# Patient Record
Sex: Male | Born: 1975 | Race: Black or African American | Hispanic: No | Marital: Single | Smoking: Current every day smoker
Health system: Southern US, Community
[De-identification: ages and names within clinical notes are randomized; demographics above are authoritative.]

## PROBLEM LIST (undated history)

## (undated) DIAGNOSIS — E785 Hyperlipidemia, unspecified: Secondary | ICD-10-CM

## (undated) DIAGNOSIS — Z87898 Personal history of other specified conditions: Secondary | ICD-10-CM

## (undated) HISTORY — DX: Personal history of other specified conditions: Z87.898

## (undated) HISTORY — PX: OTHER SURGICAL HISTORY: SHX169

## (undated) HISTORY — DX: Hyperlipidemia, unspecified: E78.5

---

## 2001-12-14 ENCOUNTER — Observation Stay (HOSPITAL_COMMUNITY): Admission: EM | Admit: 2001-12-14 | Discharge: 2001-12-14 | Payer: Self-pay | Admitting: Emergency Medicine

## 2001-12-14 ENCOUNTER — Encounter: Payer: Self-pay | Admitting: Emergency Medicine

## 2001-12-14 IMAGING — CT CT HEAD W/O CM
2 of 7 series · 13 of 40 positions shown, 16 images · non-contrast
Comparison: none

FINDINGS
CLINICAL DATA: NECK LACERATION.  BRUISING TO THE FACE, PARTICULARLY AROUND THE RIGHT EYE WITH
BLOOD FROM THE NOSE AND MOUTH.
CT HEAD WITHOUT CONTRAST MEDIA
THERE IS SOFT TISSUE SWELLING OVER THE RIGHT ORBIT AND EXTENDING ACROSS THE REGION OF THE NOSE.
THERE IS NO ACUTE INTRACRANIAL ABNORMALITY.  NO DISCRETE FRACTURE.
IMPRESSION
NEGATIVE CT HEAD.
CT MAXILLOFACIAL LTD W/O
THERE IS AN IMPACTED COMMINUTED FRACTURE OF THE NASAL BONES WITH EXTENSIVE SOFT TISSUE SWELLING.
THERE IS ALSO SOME EXTRACONAL AIR ADJACENT TO THE MEDIAL RECTUS MUSCLE IN THE RIGHT ORBIT.  THERE
IS A TINY FRACTURE IN THE MEDIAL WALL OF THE RIGHT ORBIT SEEN ON IMAGES #21 AND 22 INTO ONE OF THE
MIDDLE ETHMOID AIR CELLS.  THERE IS SOME MINIMAL OPACIFICATION OF THAT ETHMOID AIR CELL.
NO OTHER SIGNIFICANT ABNORMALITY.
THE OTHER BONY STRUCTURES ARE INTACT.
COMMINUTED NASAL BONE FRACTURE.  SMALL FRACTURE OF THE MEDIAL WALL OF THE RIGHT ORBIT INTO A MIDDLE
ETHMOID AIR CELL.
CT MULTIPLANAR RECONSTRUCTIONS
THE CORONAL RECONSTRUCTIONS DEMONSTRATE THE SLIGHT DISPLACEMENT IN THE COMMINUTION OF THE NASAL
BONE FRACTURES.   FRACTURE OF THE MEDIAL WALL OF THE RIGHT ORBIT IS NOT WELL SEEN.
NO OTHER ABNORMALITY.
NASAL BONE AND MEDIAL RIGHT ORBITAL WALL FRACTURES.

[Series 7971: — · axial · 0.33mm/px · z∈[-699,-564]mm · 12 of 64 slices shown, 15 images (1 of 2)]
[im 5/64  brain]
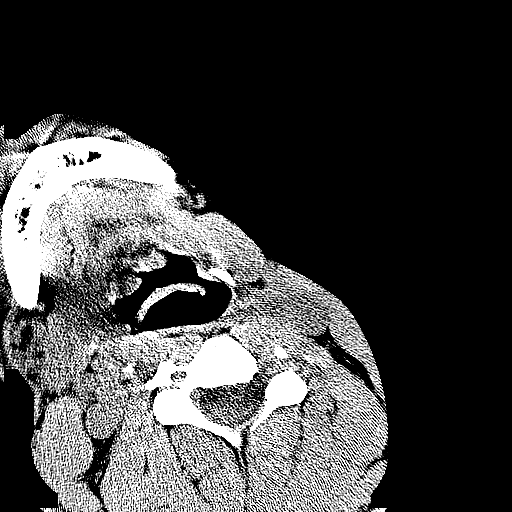
[im 5/64  bone]
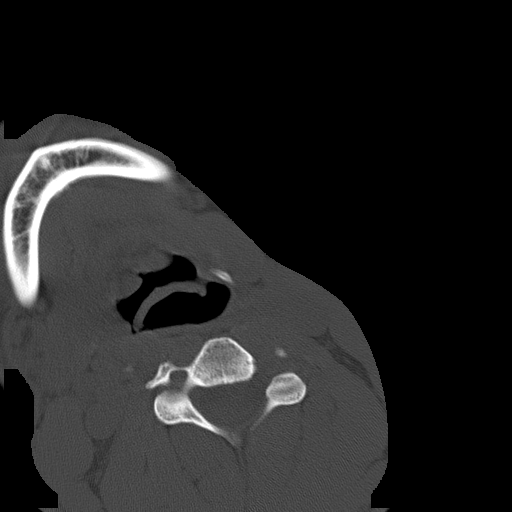
[im 10/64  brain]
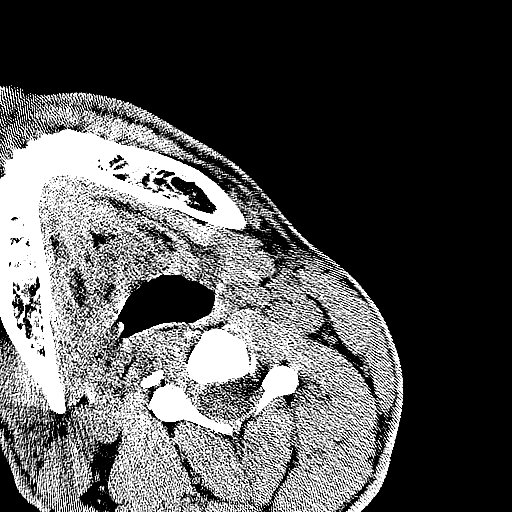
[im 15/64  brain]
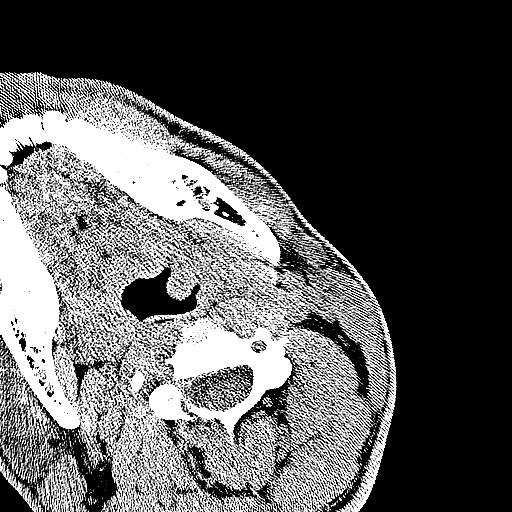
[im 20/64  brain]
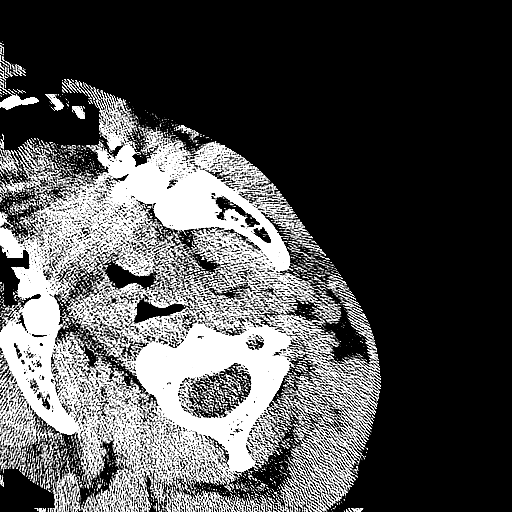
[im 25/64  brain]
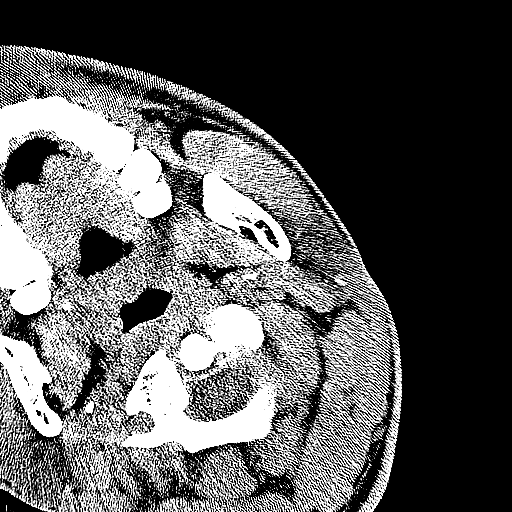
[im 25/64  bone]
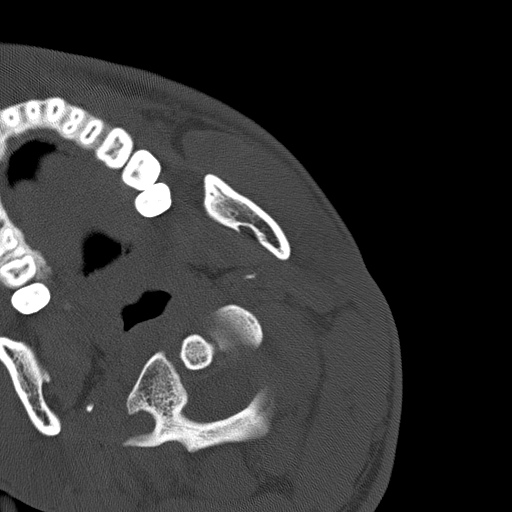
[im 30/64  brain]
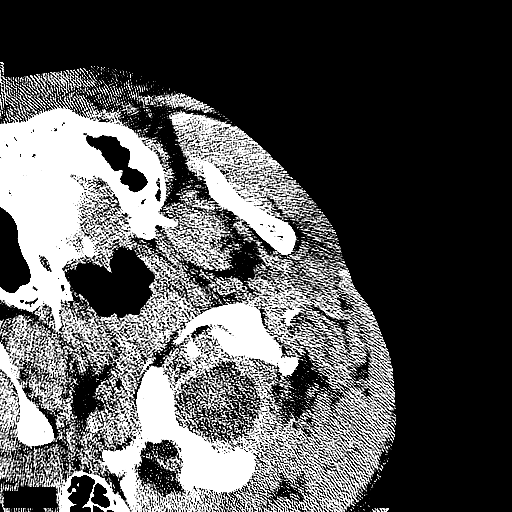
[im 34/64  brain]
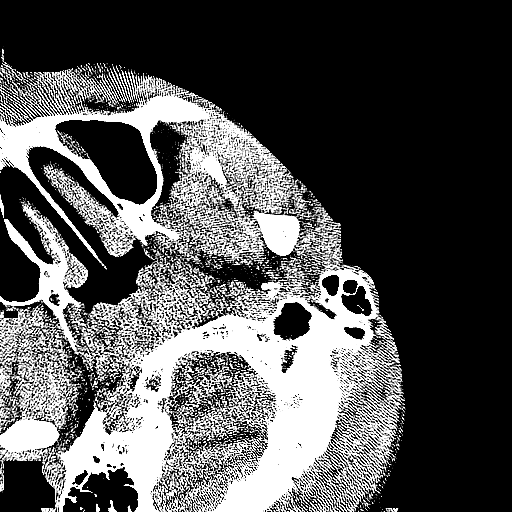
[im 39/64  brain]
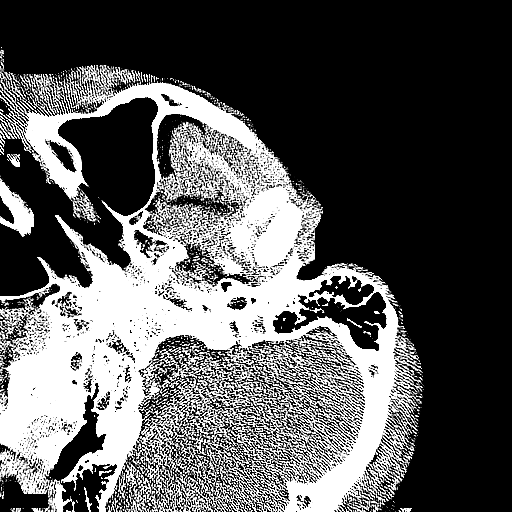
[im 44/64  brain]
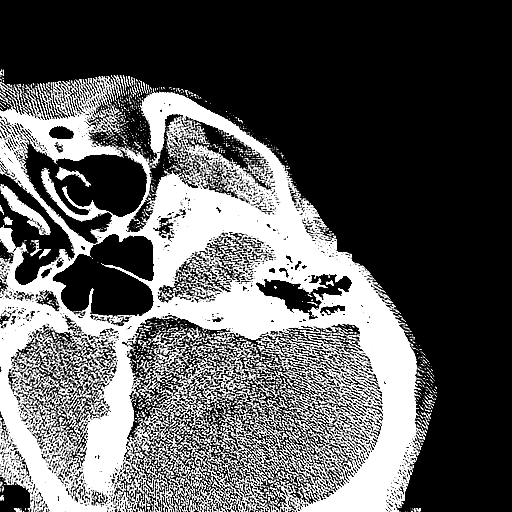
[im 44/64  bone]
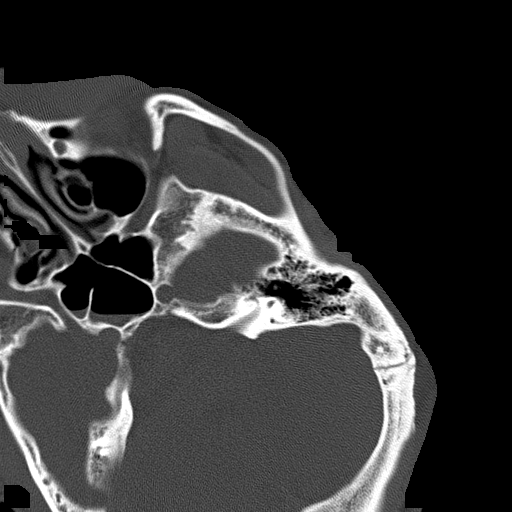
[im 49/64  brain]
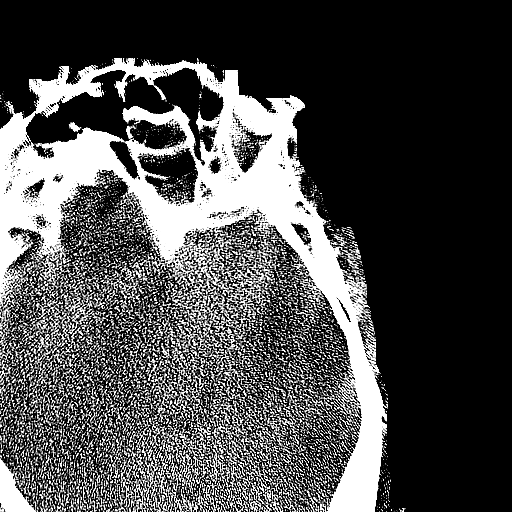
[im 54/64  brain]
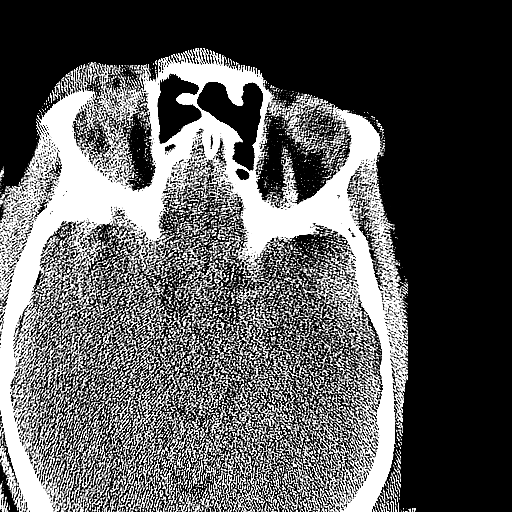
[im 59/64  brain]
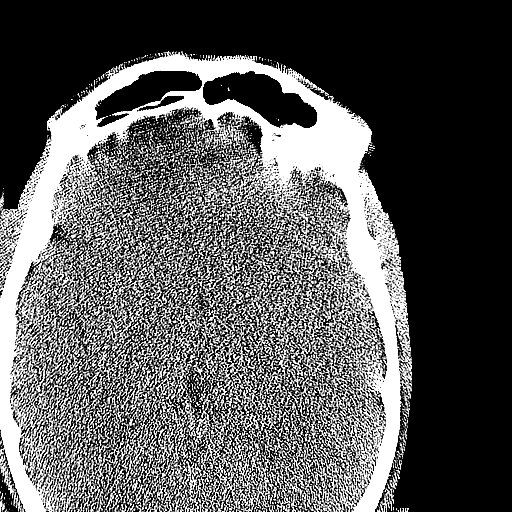

[— · coronal · 0.34mm/px · 1 of 60 slices shown (2 of 2)]
[im 30/60  brain]
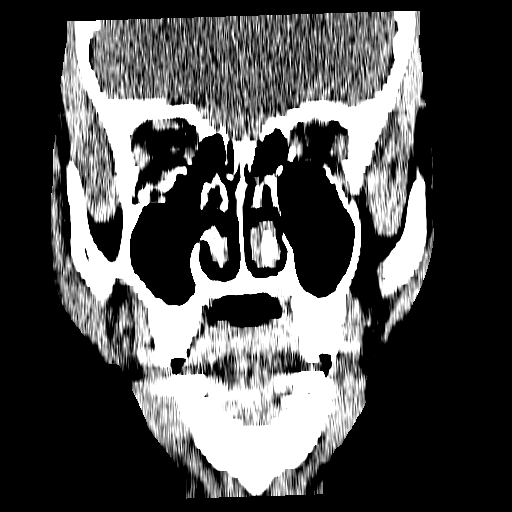

[13 of 40 positions shown; findings below may reference images not displayed]

## 2001-12-14 IMAGING — CT CT HEAD W/O CM
1 of 2 series · 13 of 30 positions shown, 17 images · non-contrast
Comparison: none

FINDINGS
CLINICAL DATA: NECK LACERATION.  BRUISING TO THE FACE, PARTICULARLY AROUND THE RIGHT EYE WITH
BLOOD FROM THE NOSE AND MOUTH.
CT HEAD WITHOUT CONTRAST MEDIA
THERE IS SOFT TISSUE SWELLING OVER THE RIGHT ORBIT AND EXTENDING ACROSS THE REGION OF THE NOSE.
THERE IS NO ACUTE INTRACRANIAL ABNORMALITY.  NO DISCRETE FRACTURE.
IMPRESSION
NEGATIVE CT HEAD.
CT MAXILLOFACIAL LTD W/O
THERE IS AN IMPACTED COMMINUTED FRACTURE OF THE NASAL BONES WITH EXTENSIVE SOFT TISSUE SWELLING.
THERE IS ALSO SOME EXTRACONAL AIR ADJACENT TO THE MEDIAL RECTUS MUSCLE IN THE RIGHT ORBIT.  THERE
IS A TINY FRACTURE IN THE MEDIAL WALL OF THE RIGHT ORBIT SEEN ON IMAGES #21 AND 22 INTO ONE OF THE
MIDDLE ETHMOID AIR CELLS.  THERE IS SOME MINIMAL OPACIFICATION OF THAT ETHMOID AIR CELL.
NO OTHER SIGNIFICANT ABNORMALITY.
THE OTHER BONY STRUCTURES ARE INTACT.
COMMINUTED NASAL BONE FRACTURE.  SMALL FRACTURE OF THE MEDIAL WALL OF THE RIGHT ORBIT INTO A MIDDLE
ETHMOID AIR CELL.
CT MULTIPLANAR RECONSTRUCTIONS
THE CORONAL RECONSTRUCTIONS DEMONSTRATE THE SLIGHT DISPLACEMENT IN THE COMMINUTION OF THE NASAL
BONE FRACTURES.   FRACTURE OF THE MEDIAL WALL OF THE RIGHT ORBIT IS NOT WELL SEEN.
NO OTHER ABNORMALITY.
NASAL BONE AND MEDIAL RIGHT ORBITAL WALL FRACTURES.

[Series 7968: — · axial · 0.49mm/px · z∈[-570,-460]mm · 13 of 26 slices shown, 17 images]
[im 2/26  brain]
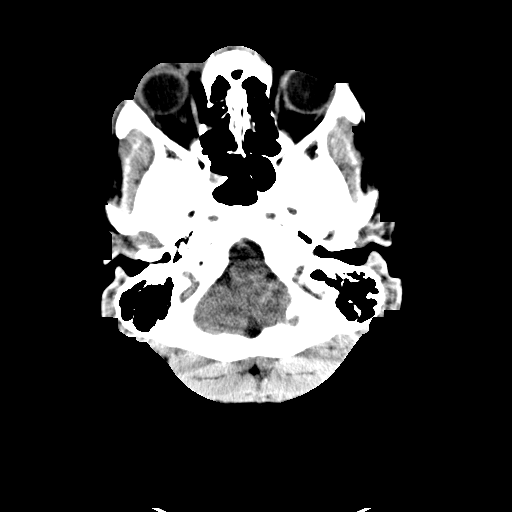
[im 2/26  bone]
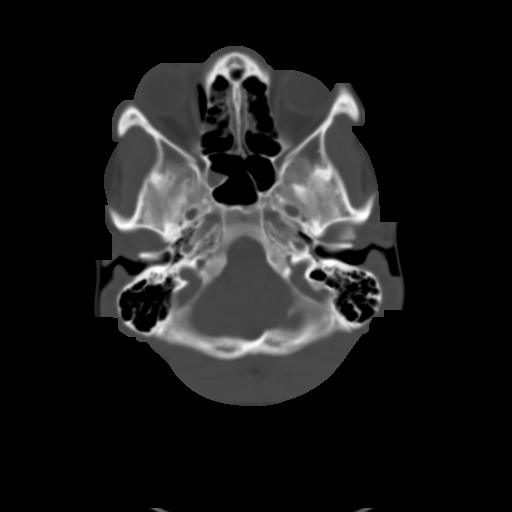
[im 4/26  brain]
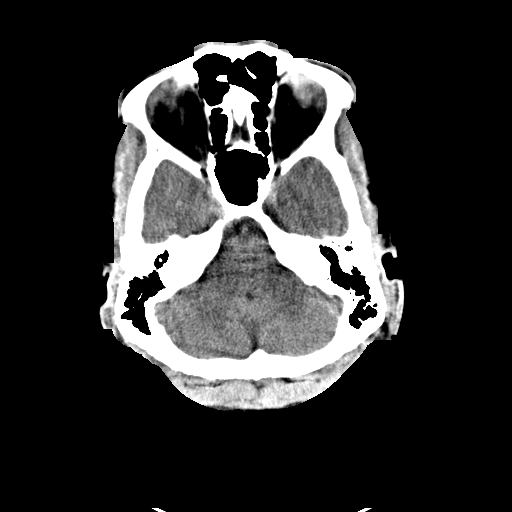
[im 6/26  brain]
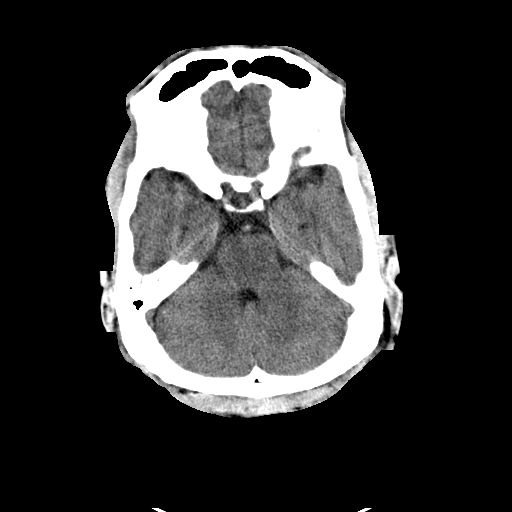
[im 8/26  brain]
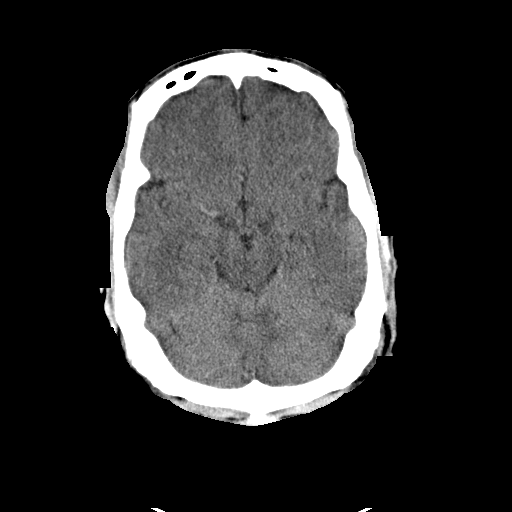
[im 9/26  brain]
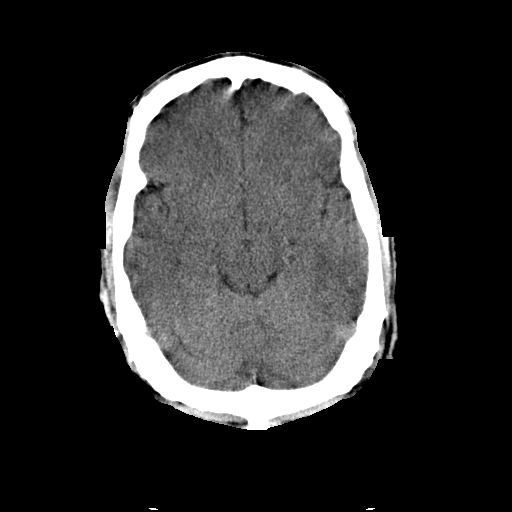
[im 9/26  bone]
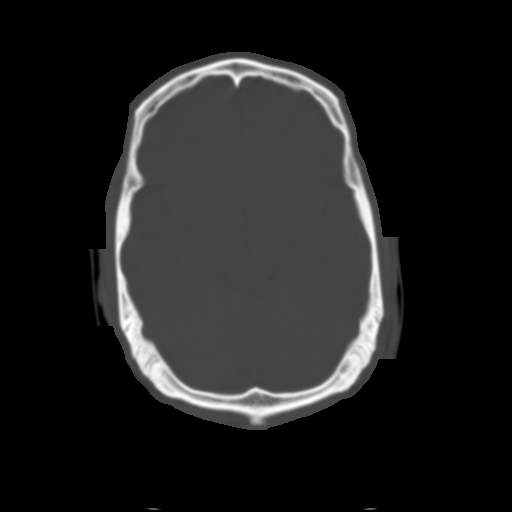
[im 11/26  brain]
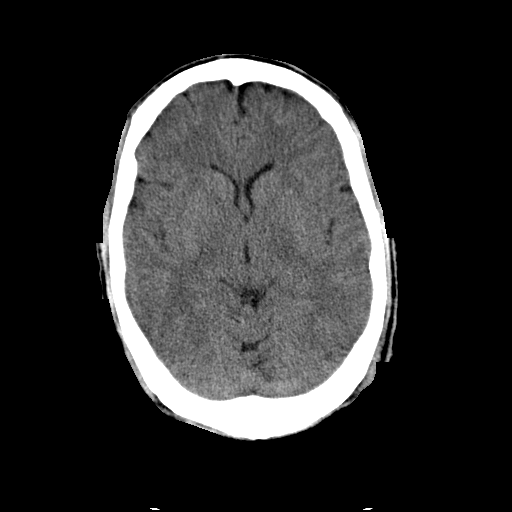
[im 13/26  brain]
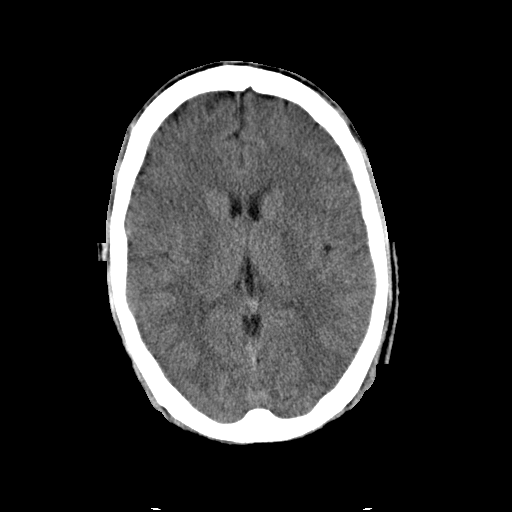
[im 15/26  brain]
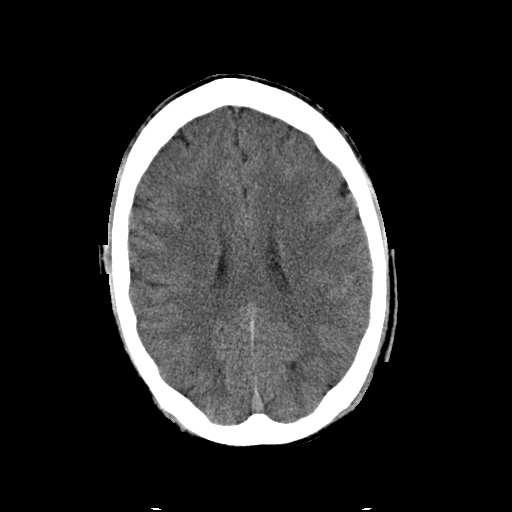
[im 17/26  brain]
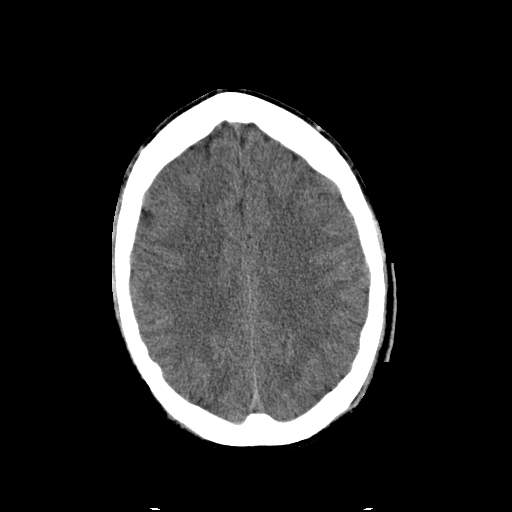
[im 17/26  bone]
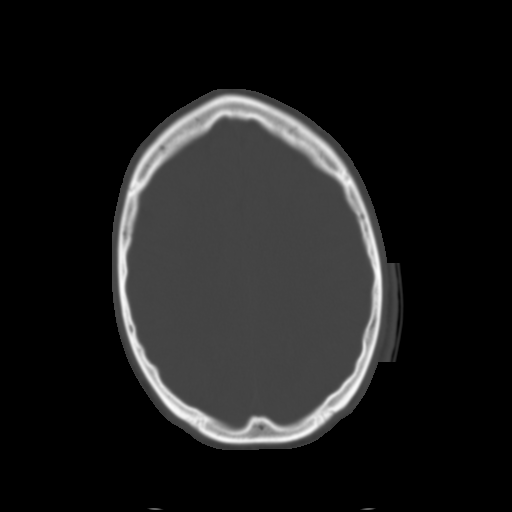
[im 18/26  brain]
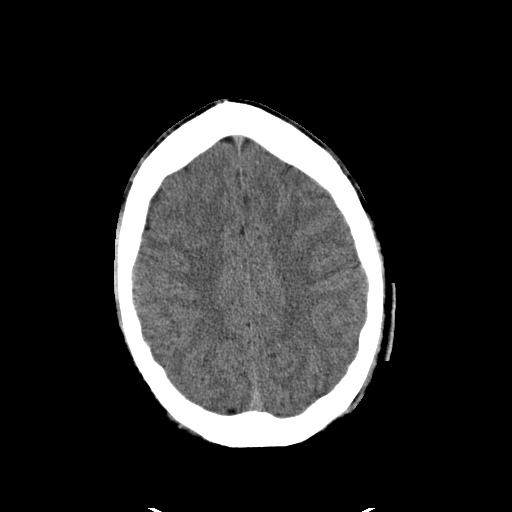
[im 20/26  brain]
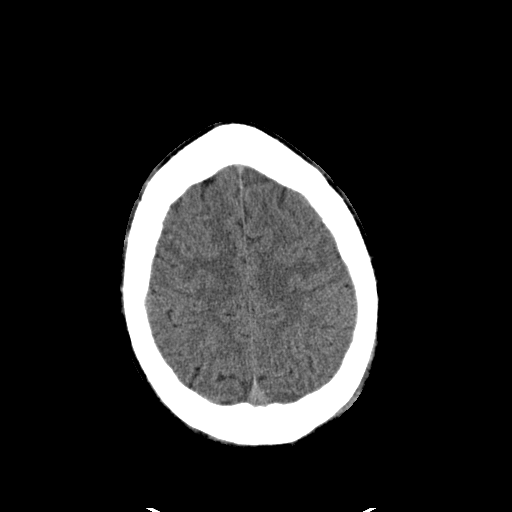
[im 22/26  brain]
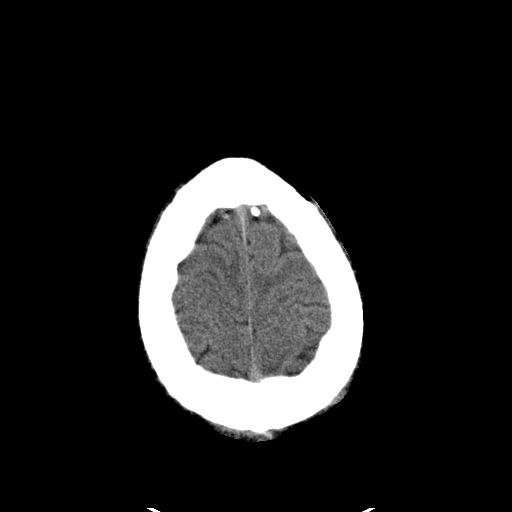
[im 24/26  brain]
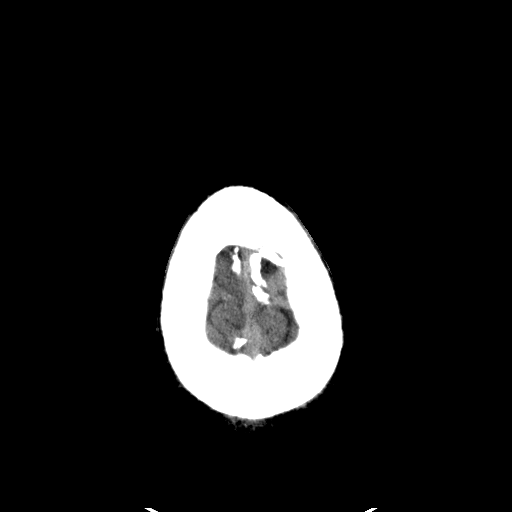
[im 24/26  bone]
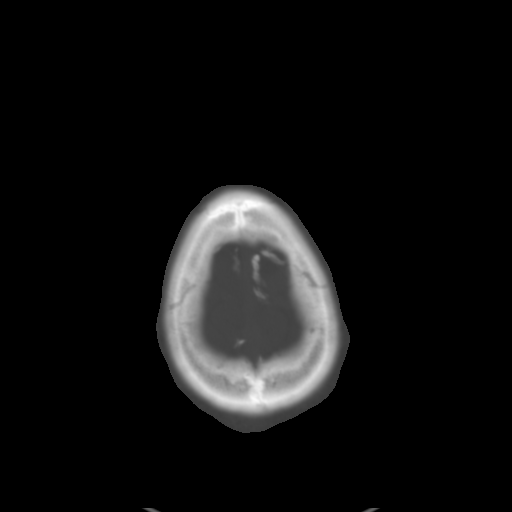

[13 of 30 positions shown; findings below may reference images not displayed]

## 2003-07-07 ENCOUNTER — Emergency Department (HOSPITAL_COMMUNITY): Admission: EM | Admit: 2003-07-07 | Discharge: 2003-07-07 | Payer: Self-pay | Admitting: Emergency Medicine

## 2003-07-07 ENCOUNTER — Encounter: Payer: Self-pay | Admitting: Emergency Medicine

## 2004-02-24 ENCOUNTER — Emergency Department (HOSPITAL_COMMUNITY): Admission: EM | Admit: 2004-02-24 | Discharge: 2004-02-24 | Payer: Self-pay | Admitting: Emergency Medicine

## 2006-12-12 ENCOUNTER — Emergency Department (HOSPITAL_COMMUNITY): Admission: EM | Admit: 2006-12-12 | Discharge: 2006-12-12 | Payer: Self-pay | Admitting: Emergency Medicine

## 2006-12-12 IMAGING — CR DG CHEST 2V
2 series · 2 of 2 positions shown · non-contrast
Comparison: none

CLINICAL DATA: Headache, chills and cough.    
 CHEST - 2 VIEW:

[view not recorded (1 of 2)]
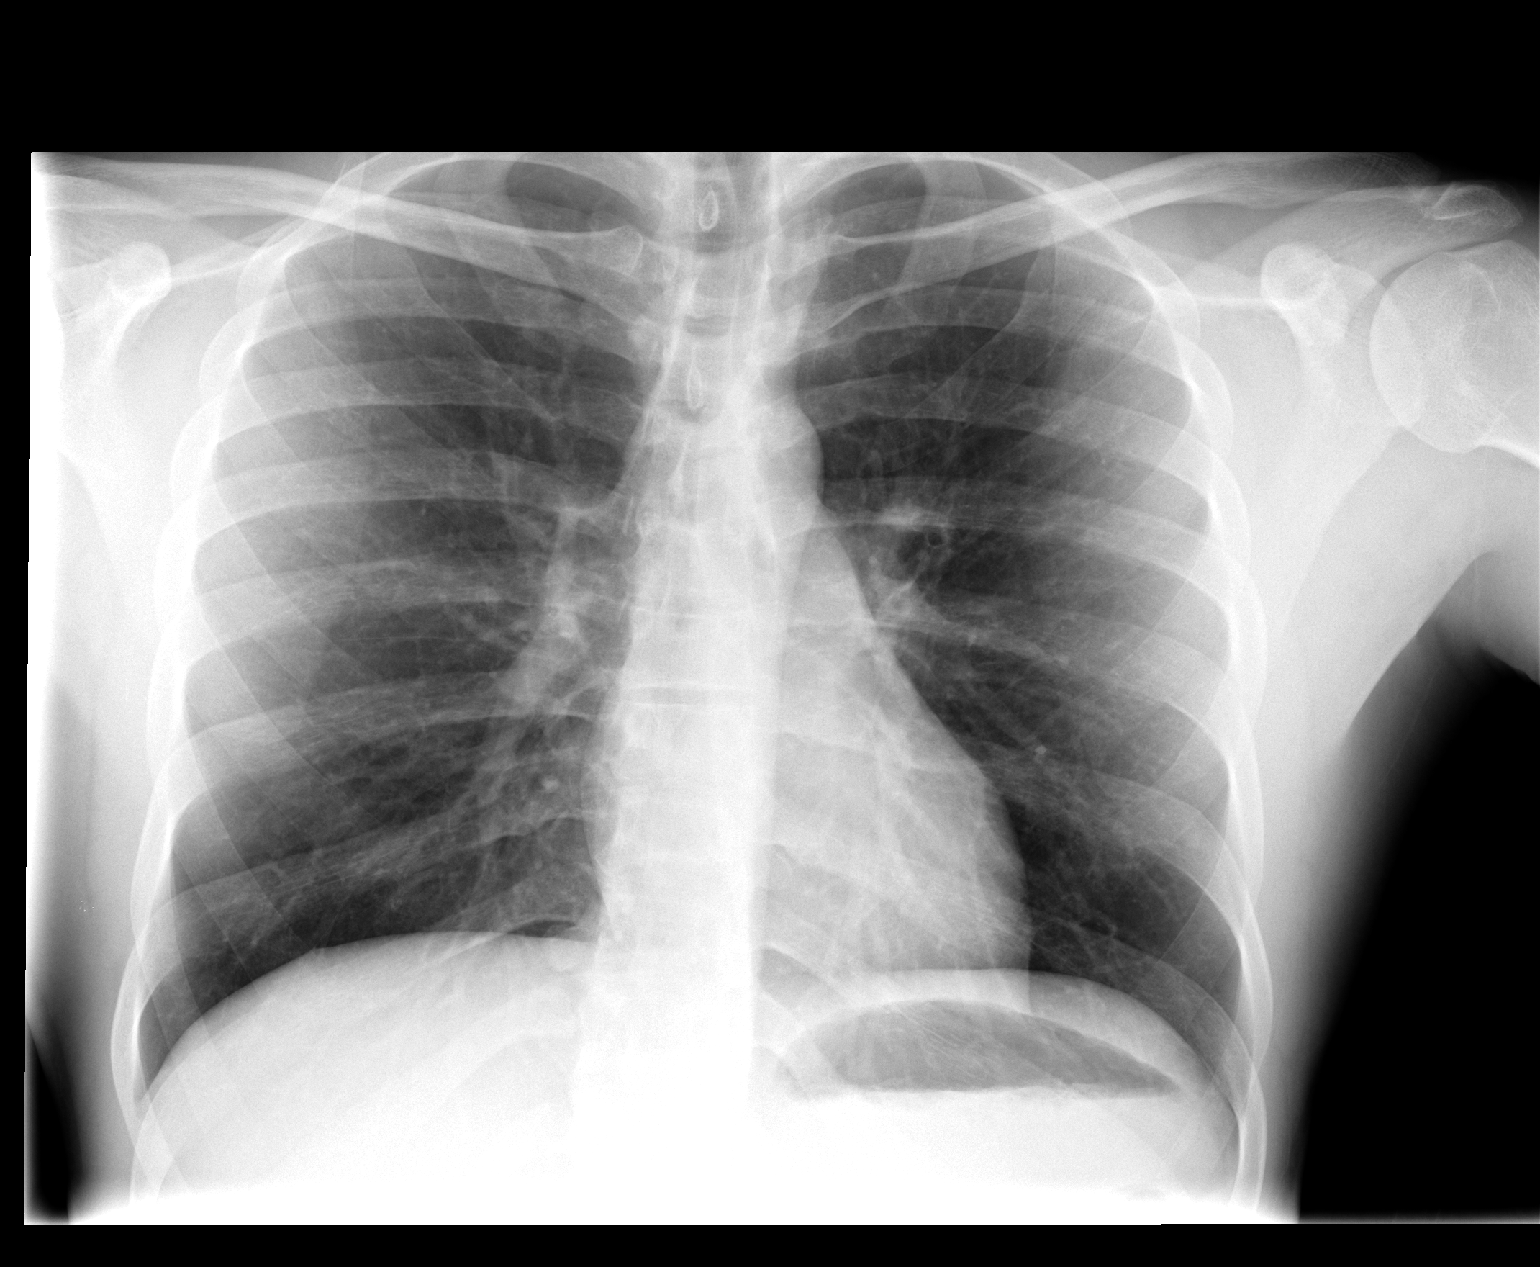

[view not recorded (2 of 2)]
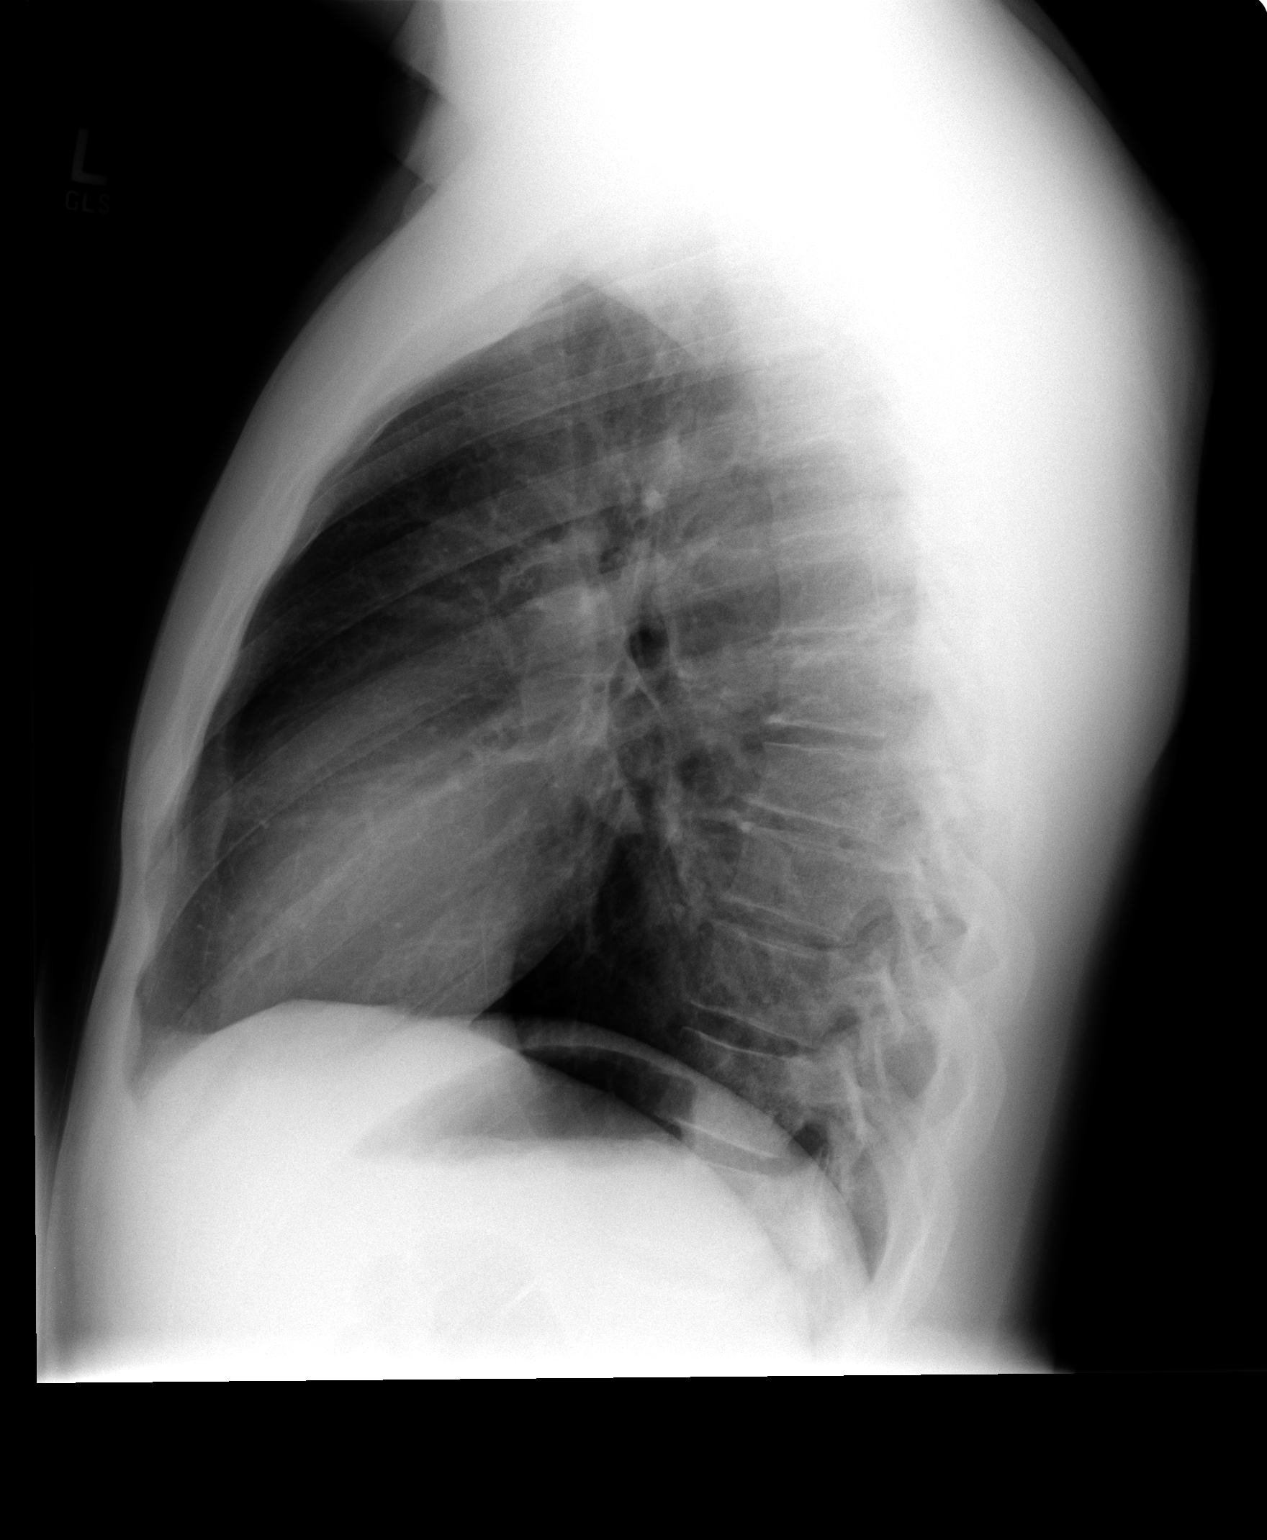

[2 of 2 positions shown; findings below may reference images not displayed]

FINDINGS: Heart size is normal. No effusions or edema.  No airspace opacities are identified.
IMPRESSION: No active disease.

## 2007-12-06 ENCOUNTER — Emergency Department (HOSPITAL_COMMUNITY): Admission: EM | Admit: 2007-12-06 | Discharge: 2007-12-06 | Payer: Self-pay | Admitting: Emergency Medicine
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
## 2015-01-14 ENCOUNTER — Encounter (HOSPITAL_COMMUNITY): Payer: Self-pay | Admitting: *Deleted

## 2015-01-14 ENCOUNTER — Emergency Department (HOSPITAL_COMMUNITY)
Admission: EM | Admit: 2015-01-14 | Discharge: 2015-01-14 | Disposition: A | Discharge status: Home / Self Care | Payer: Self-pay | Attending: Emergency Medicine | Admitting: Emergency Medicine

## 2015-01-14 DIAGNOSIS — K047 Periapical abscess without sinus: Secondary | ICD-10-CM | POA: Insufficient documentation

## 2015-01-14 DIAGNOSIS — K029 Dental caries, unspecified: Secondary | ICD-10-CM | POA: Insufficient documentation

## 2015-01-14 DIAGNOSIS — Z720 Tobacco use: Secondary | ICD-10-CM | POA: Insufficient documentation

## 2015-01-14 MED ORDER — LIDOCAINE HCL (PF) 1 % IJ SOLN
INTRAMUSCULAR | Status: AC
  Administered 2015-01-14: 2.1 mL
  Filled 2015-01-14: qty 5

## 2015-01-14 MED ORDER — HYDROCODONE-ACETAMINOPHEN 5-325 MG PO TABS
2.0000 | ORAL_TABLET | Freq: Once | ORAL | Status: AC
  Administered 2015-01-14: 2 via ORAL
  Filled 2015-01-14: qty 2

## 2015-01-14 MED ORDER — HYDROCODONE-ACETAMINOPHEN 5-325 MG PO TABS: 1.0000 | ORAL_TABLET | ORAL | Status: DC | PRN

## 2015-01-14 MED ORDER — CEFTRIAXONE SODIUM 1 G IJ SOLR
1.0000 g | Freq: Once | INTRAMUSCULAR | Status: AC
  Administered 2015-01-14: 1 g via INTRAMUSCULAR
  Filled 2015-01-14: qty 10

## 2015-01-14 MED ORDER — ONDANSETRON HCL 4 MG PO TABS
4.0000 mg | ORAL_TABLET | Freq: Once | ORAL | Status: AC
  Administered 2015-01-14: 4 mg via ORAL
  Filled 2015-01-14: qty 1

## 2015-01-14 MED ORDER — AMOXICILLIN 500 MG PO CAPS: 500.0000 mg | ORAL_CAPSULE | Freq: Three times a day (TID) | ORAL | Status: DC

## 2015-01-14 MED ORDER — IBUPROFEN 800 MG PO TABS: 800.0000 mg | ORAL_TABLET | Freq: Three times a day (TID) | ORAL | Status: DC

## 2015-01-14 MED ORDER — IBUPROFEN 800 MG PO TABS
800.0000 mg | ORAL_TABLET | Freq: Once | ORAL | Status: AC
  Administered 2015-01-14: 800 mg via ORAL
  Filled 2015-01-14: qty 1

## 2015-01-14 NOTE — ED Notes (Signed)
Top right tooth pain since 1400 today.  Has taken Motrin 444m x 1 without relief.

## 2015-01-14 NOTE — ED Provider Notes (Signed)
CSN: [id number]     Arrival date & time 01/14/15  1807 History   First MD Initiated Contact with Patient 01/14/15 1924     Chief Complaint  Patient presents with  . Dental Pain     (Consider location/radiation/quality/duration/timing/severity/associated sxs/prior Treatment) Patient is a 39 y.o. male presenting with tooth pain. The history is provided by the patient.  Dental Pain Location:  Upper Upper teeth location:  2/RU 2nd molar Quality:  Aching, throbbing and shooting Severity:  Severe Onset quality:  Gradual Duration:  6 hours Timing:  Intermittent Progression:  Worsening Chronicity:  Chronic Context: abscess, dental caries and poor dentition   Relieved by:  Nothing Worsened by:  Hot food/drink and cold food/drink Ineffective treatments:  NSAIDs Associated symptoms: facial pain and gum swelling   Associated symptoms: no fever, no neck pain and no trismus   Risk factors: lack of dental care   Risk factors: no diabetes and no immunosuppression     History reviewed. No pertinent past medical history. History reviewed. No pertinent past surgical history. History reviewed. No pertinent family history. History  Substance Use Topics  . Smoking status: Current Every Day Smoker -- 0.75 packs/day    Types: Cigarettes  . Smokeless tobacco: Not on file  . Alcohol Use: 1.2 oz/week    2 Cans of beer per week     Comment: daily    Review of Systems  Constitutional: Negative for fever and activity change.       All ROS Neg except as noted in HPI  HENT: Positive for dental problem. Negative for nosebleeds.   Eyes: Negative for photophobia and discharge.  Respiratory: Negative for cough, shortness of breath and wheezing.   Cardiovascular: Negative for chest pain and palpitations.  Gastrointestinal: Negative for abdominal pain and blood in stool.  Genitourinary: Negative for dysuria, frequency and hematuria.  Musculoskeletal: Negative for back pain, arthralgias and neck pain.   Skin: Negative.   Neurological: Negative for dizziness, seizures and speech difficulty.  Psychiatric/Behavioral: Negative for hallucinations and confusion.      Allergies  Review of patient's allergies indicates no known allergies.  Home Medications   Prior to Admission medications   Medication Sig Start Date End Date Taking? Authorizing Provider  ibuprofen (ADVIL,MOTRIN) 200 MG tablet Take 400 mg by mouth every 6 (six) hours as needed for moderate pain.   Yes Historical Provider, MD   BP 132/80 mmHg  Pulse 72  Temp(Src) 98.8 F (37.1 C) (Oral)  Resp 18  SpO2 99% Physical Exam  Constitutional: He is oriented to person, place, and time. He appears well-developed and well-nourished.  Non-toxic appearance.  HENT:  Head: Normocephalic.  Right Ear: Tympanic membrane and external ear normal.  Left Ear: Tympanic membrane and external ear normal.  Small abscess noted at the upper second molar area. No drainage. The airway is patent. There is no swelling under the tongue.  There is tenderness to the right side of the face. The area is not hot to touch.  Eyes: EOM and lids are normal. Pupils are equal, round, and reactive to light.  Neck: Normal range of motion. Neck supple. Carotid bruit is not present.  Cardiovascular: Normal rate, regular rhythm, normal heart sounds, intact distal pulses and normal pulses.   Pulmonary/Chest: Breath sounds normal. No respiratory distress.  Abdominal: Soft. Bowel sounds are normal. There is no tenderness. There is no guarding.  Musculoskeletal: Normal range of motion.  Lymphadenopathy:       Head (right side): No  submandibular adenopathy present.       Head (left side): No submandibular adenopathy present.    He has no cervical adenopathy.  Neurological: He is alert and oriented to person, place, and time. He has normal strength. No cranial nerve deficit or sensory deficit.  Skin: Skin is warm and dry.  Psychiatric: He has a normal mood and  affect. His speech is normal.  Nursing note and vitals reviewed.   ED Course  Procedures (including critical care time) Labs Review Labs Reviewed - No data to display  Imaging Review No results found.   EKG Interpretation None      MDM  Vital signs stable. No evidence for Ludwig's angina. No airway compromise. Pt to be treated with rocephin in the ED. Rx for amoxil, norco and ibuprofen given to the patient. Pt to see a dentist as soon as possible.   Final diagnoses:  None    I have reviewed nursing notes, vital signs, and all appropriate lab and imaging results for this patient.Lily Kocher, PA-C 01/18/15 1355  Dorie Rank, MD 01/19/15 2107

## 2015-01-14 NOTE — Discharge Instructions (Signed)
Dental Abscess IT IS IMPORTANT THAT YOU SEE A DENTIST AS SOON AS POSSIBLE.  NORCO MAY CAUSE DROWSINESS, USE WITH CAUTION.                                                                                                             A dental abscess is a collection of infected fluid (pus) from a bacterial infection in the inner part of the tooth (pulp). It usually occurs at the end of the tooth's root.  CAUSES   Severe tooth decay.  Trauma to the tooth that allows bacteria to enter into the pulp, such as a broken or chipped tooth. SYMPTOMS   Severe pain in and around the infected tooth.  Swelling and redness around the abscessed tooth or in the mouth or face.  Tenderness.  Pus drainage.  Bad breath.  Bitter taste in the mouth.  Difficulty swallowing.  Difficulty opening the mouth.  Nausea.  Vomiting.  Chills.  Swollen neck glands. DIAGNOSIS   A medical and dental history will be taken.  An examination will be performed by tapping on the abscessed tooth.  X-rays may be taken of the tooth to identify the abscess. TREATMENT The goal of treatment is to eliminate the infection. You may be prescribed antibiotic medicine to stop the infection from spreading. A root canal may be performed to save the tooth. If the tooth cannot be saved, it may be pulled (extracted) and the abscess may be drained.  HOME CARE INSTRUCTIONS  Only take over-the-counter or prescription medicines for pain, fever, or discomfort as directed by your caregiver.  Rinse your mouth (gargle) often with salt water ( tsp salt in 8 oz [250 ml] of warm water) to relieve pain or swelling.  Do not drive after taking pain medicine (narcotics).  Do not apply heat to the outside of your face.  Return to your dentist for further treatment as directed. SEEK MEDICAL CARE IF:  Your pain is not helped by medicine.  Your pain is getting worse instead of better. SEEK IMMEDIATE MEDICAL CARE IF:  You have a fever or  persistent symptoms for more than 2-3 days.  You have a fever and your symptoms suddenly get worse.  You have chills or a very bad headache.  You have problems breathing or swallowing.  You have trouble opening your mouth.  You have swelling in the neck or around the eye. Document Released: 10/15/2005 Document Revised: 07/09/2012 Document Reviewed: 01/23/2011 [facility] Patient Information 2015 [place], [place]. This information is not intended to replace advice given to you by your health care provider. Make sure you discuss any questions you have with your health care provider.

## 2015-01-14 NOTE — ED Notes (Signed)
Pt alert & oriented x4, stable gait. Patient given discharge instructions, paperwork & prescription(s). Patient informed not to drive, operate any equipment & handel any important documents 4 hours after taking pain medication. Patient  instructed to stop at the registration desk to finish any additional paperwork. Patient  verbalized understanding. Pt left department w/ no further questions.

## 2016-02-25 ENCOUNTER — Emergency Department (HOSPITAL_COMMUNITY): Payer: Self-pay

## 2016-02-25 ENCOUNTER — Emergency Department (HOSPITAL_COMMUNITY)
Admission: EM | Admit: 2016-02-25 | Discharge: 2016-02-25 | Disposition: A | Discharge status: Home / Self Care | Payer: Self-pay | Attending: Emergency Medicine | Admitting: Emergency Medicine

## 2016-02-25 ENCOUNTER — Encounter (HOSPITAL_COMMUNITY): Payer: Self-pay | Admitting: *Deleted

## 2016-02-25 DIAGNOSIS — F17210 Nicotine dependence, cigarettes, uncomplicated: Secondary | ICD-10-CM | POA: Insufficient documentation

## 2016-02-25 DIAGNOSIS — M62830 Muscle spasm of back: Secondary | ICD-10-CM | POA: Insufficient documentation

## 2016-02-25 IMAGING — DX DG CERVICAL SPINE COMPLETE 4+V
5 series · 6 of 6 positions shown · non-contrast
Comparison: None

CLINICAL DATA: Neck pain.  Pain in back of skull for 4 days.

EXAM:
CERVICAL SPINE - COMPLETE 4+ VIEW

[c-spine lat]
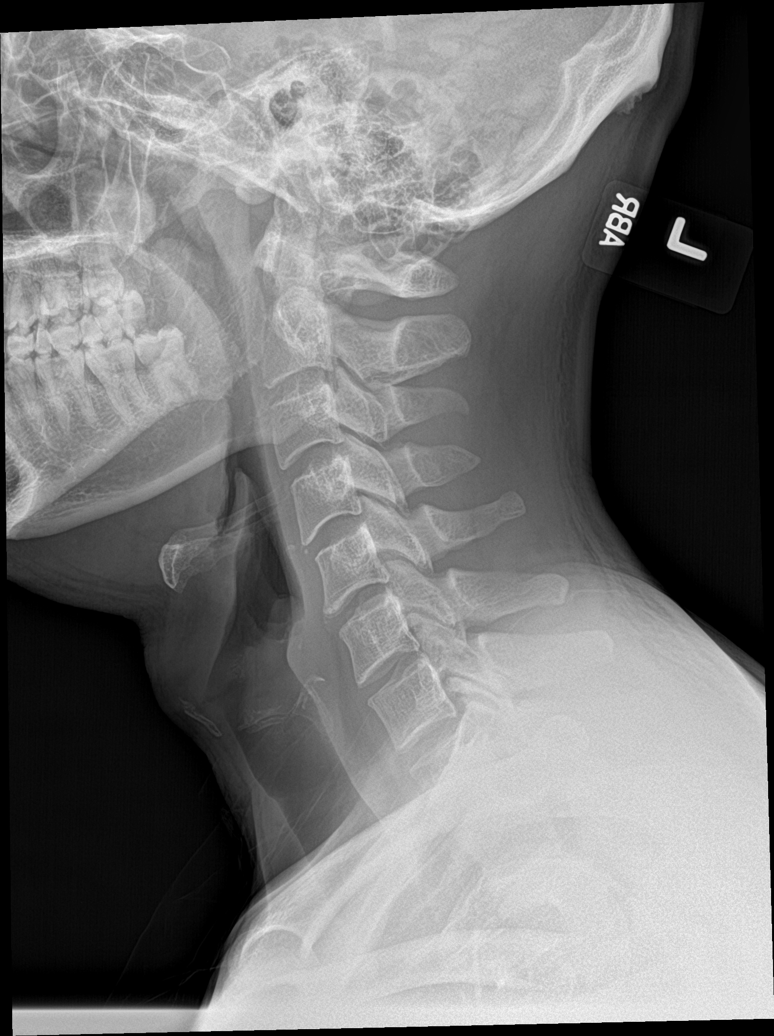

[c-spine obl (1 of 2)]
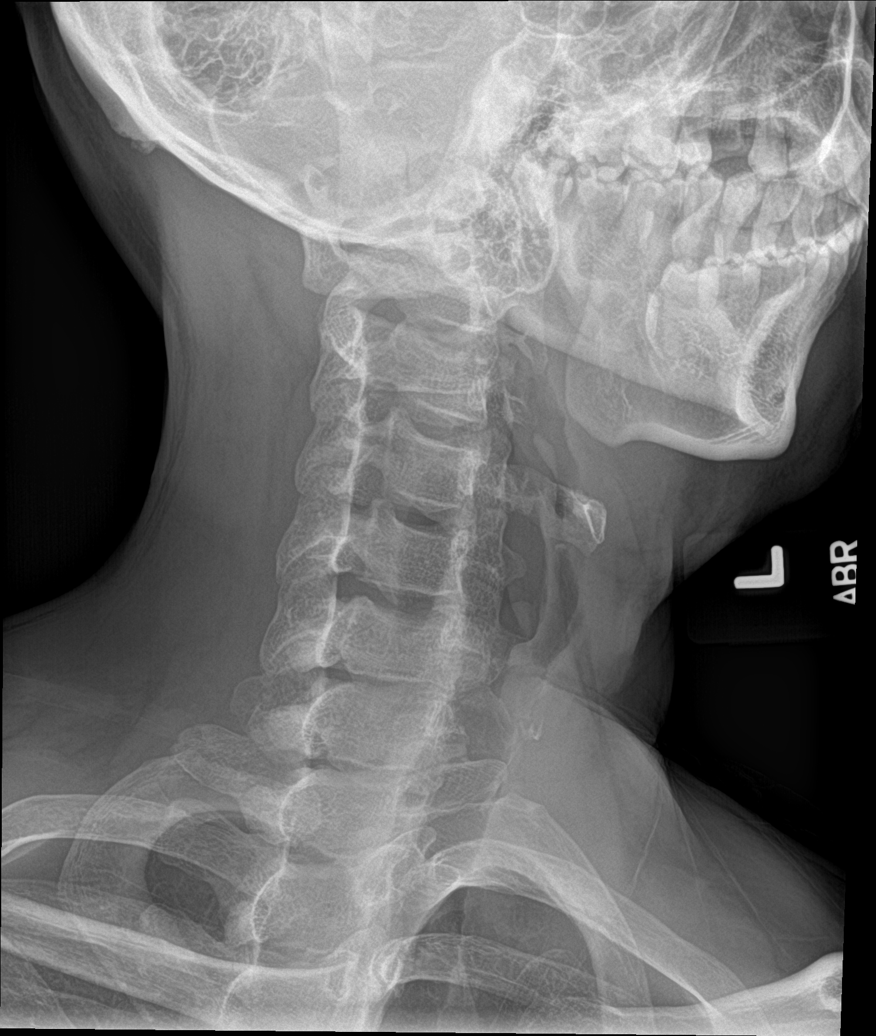

[c-spine obl (2 of 2)]
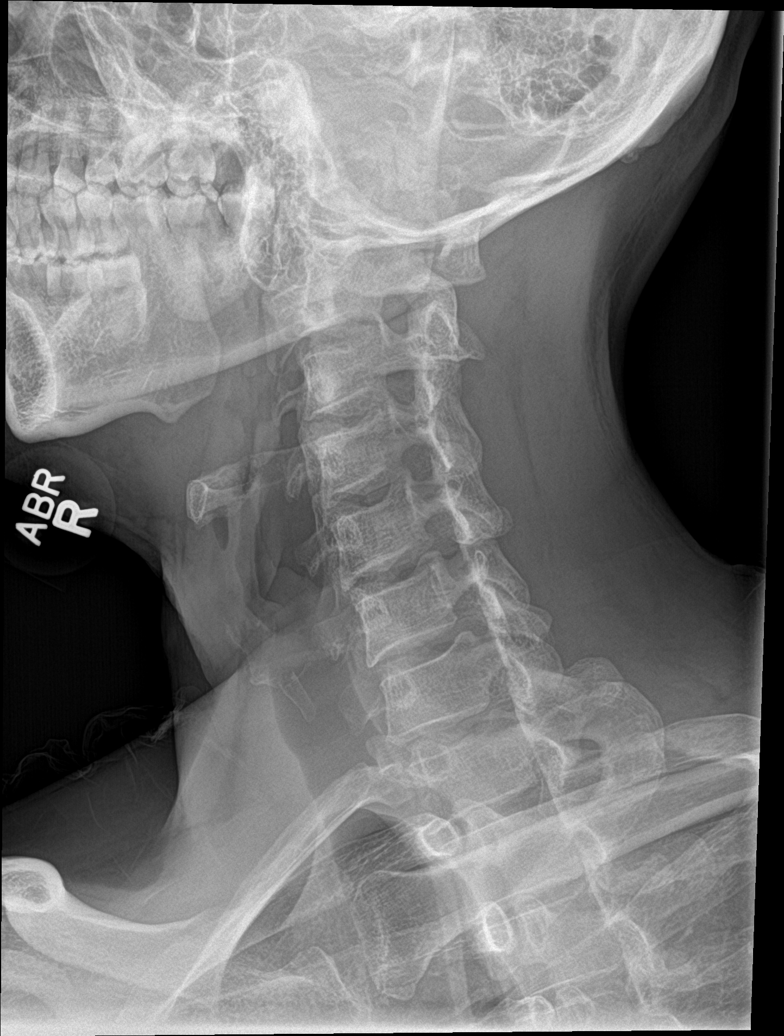

[c-spine ap]
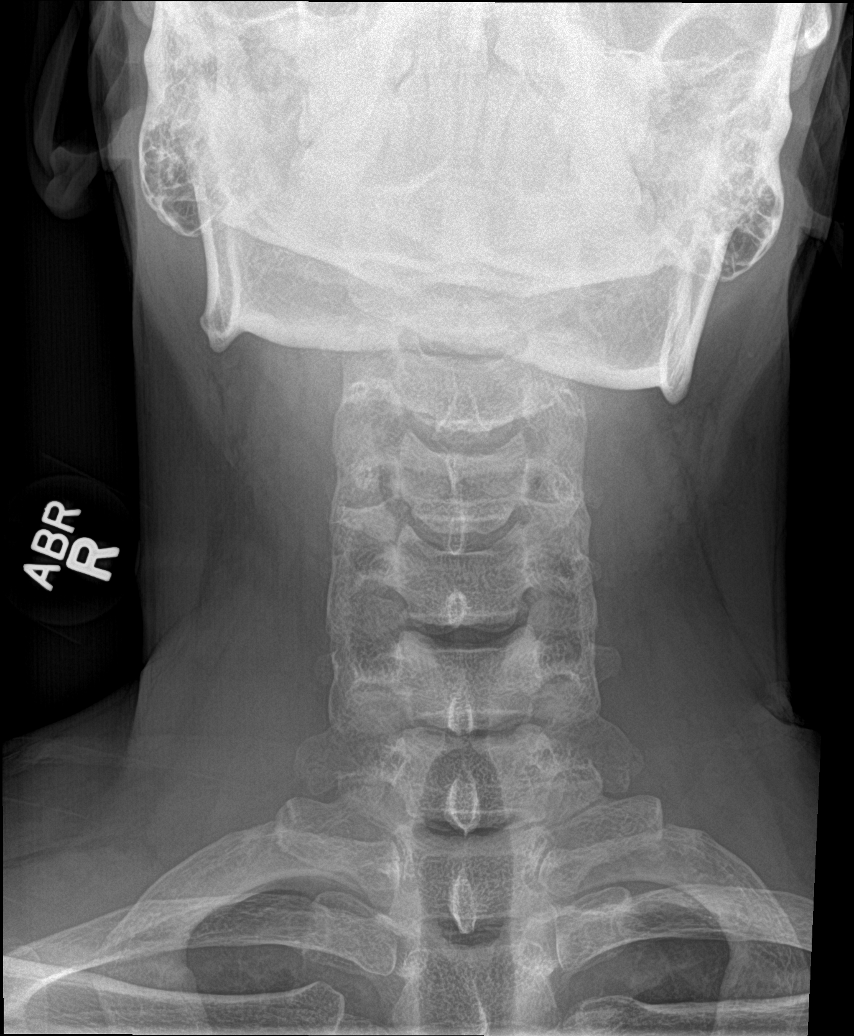

[Series 5: c-spine open mouth · 0.14mm/px · 2 of 2 slices shown]
[im 1/2]
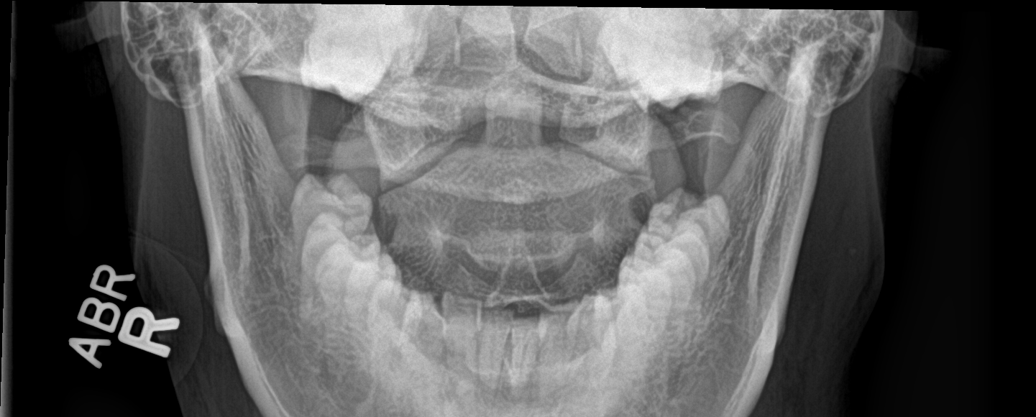
[im 2/2]
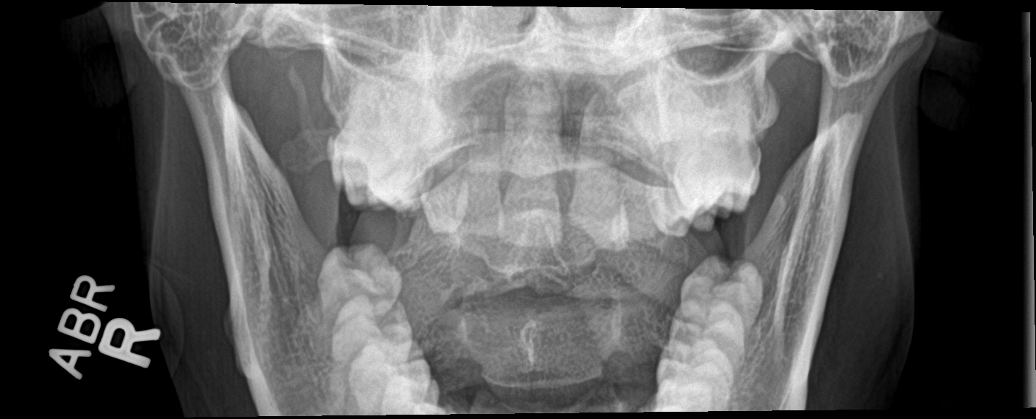

[6 of 6 positions shown; findings below may reference images not displayed]

FINDINGS: Normal alignment of the cervical spine. The vertebral body heights
are well preserved. There is no fracture or subluxation identified.
No radio-opaque foreign body or soft tissue calcification.
IMPRESSION: 1. Normal appearance of the cervical spine.

## 2016-02-25 MED ORDER — DOXYCYCLINE HYCLATE 100 MG PO CAPS: 100.0000 mg | ORAL_CAPSULE | Freq: Two times a day (BID) | ORAL | Status: DC

## 2016-02-25 MED ORDER — DIAZEPAM 5 MG PO TABS
5.0000 mg | ORAL_TABLET | Freq: Once | ORAL | Status: AC
Start: 2016-02-25 — End: 2016-02-25
  Administered 2016-02-25: 5 mg via ORAL
  Filled 2016-02-25: qty 1

## 2016-02-25 MED ORDER — DOXYCYCLINE HYCLATE 100 MG PO TABS
100.0000 mg | ORAL_TABLET | Freq: Once | ORAL | Status: AC
  Administered 2016-02-25: 100 mg via ORAL
  Filled 2016-02-25: qty 1

## 2016-02-25 MED ORDER — CYCLOBENZAPRINE HCL 10 MG PO TABS: 10.0000 mg | ORAL_TABLET | Freq: Two times a day (BID) | ORAL | Status: DC | PRN

## 2016-02-25 MED ORDER — DICLOFENAC SODIUM 50 MG PO TBEC: 50.0000 mg | DELAYED_RELEASE_TABLET | Freq: Two times a day (BID) | ORAL | Status: DC

## 2016-02-25 MED ORDER — KETOROLAC TROMETHAMINE 60 MG/2ML IM SOLN
30.0000 mg | Freq: Once | INTRAMUSCULAR | Status: AC
  Administered 2016-02-25: 30 mg via INTRAMUSCULAR
  Filled 2016-02-25: qty 2

## 2016-02-25 NOTE — Discharge Instructions (Signed)
Be sure you are drinking plenty of water and gator aid. Dehydration can cause you to have muscle cramps. Take the medication as directed. Do not take the muscle relaxant if driving as it will make you sleepy. Return for worsening symptoms

## 2016-02-25 NOTE — ED Provider Notes (Signed)
CSN: [id number]     Arrival date & time 02/25/16  2029 History   First MD Initiated Contact with Patient 02/25/16 2039     Chief Complaint  Patient presents with  . Back Pain     (Consider location/radiation/quality/duration/timing/severity/associated sxs/prior Treatment) HPI Pascal Stiggers Sherk is a 40 y.o. male who presents to the ED with pain between his shoulder blades and to the right side of his neck that started a few days ago and has gotten worse. 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 He states that he is concerned that his symptoms may be the result of a tick bite. 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 He denies fever, chills, headache or rash. Patient has not taken any medication for pain.   History reviewed. No pertinent past medical history. History reviewed. No pertinent past surgical history. History reviewed. No pertinent family history. Social History  Substance Use Topics  . Smoking status: Current Every Day Smoker -- 0.75 packs/day    Types: Cigarettes  . Smokeless tobacco: None  . Alcohol Use: 1.2 oz/week    2 Cans of beer per week     Comment: daily    Review of Systems Negative except as stated in HPI   Allergies  Review of patient's allergies indicates no known allergies.  Home Medications   Prior to Admission medications   Medication Sig Start Date End Date Taking? Authorizing Provider  amoxicillin (AMOXIL) 500 MG capsule Take 1 capsule (500 mg total) by mouth 3 (three) times daily. 01/14/15   Lily Kocher, PA-C  cyclobenzaprine (FLEXERIL) 10 MG tablet Take 1 tablet (10 mg total) by mouth 2 (two) times daily as needed for muscle spasms. 02/25/16   Maria Coin Bunnie Pion, NP  diclofenac (VOLTAREN) 50 MG EC tablet Take 1 tablet (50 mg total) by mouth 2 (two) times daily. 02/25/16   Oriyah Lamphear Bunnie Pion, NP  doxycycline (VIBRAMYCIN) 100 MG capsule Take 1 capsule  (100 mg total) by mouth 2 (two) times daily. 02/25/16   Viraaj Vorndran Bunnie Pion, NP  HYDROcodone-acetaminophen (NORCO/VICODIN) 5-325 MG per tablet Take 1 tablet by mouth every 4 (four) hours as needed. 01/14/15   Lily Kocher, PA-C  ibuprofen (ADVIL,MOTRIN) 800 MG tablet Take 1 tablet (800 mg total) by mouth 3 (three) times daily. 01/14/15   Lily Kocher, PA-C   BP 123/81 mmHg  Pulse 85  Temp(Src) 98.7 F (37.1 C) (Tympanic)  Resp 20  Ht 5' 9"  (1.753 m)  Wt 72.122 kg  BMI 23.47 kg/m2  SpO2 99% Physical Exam  Constitutional: He is oriented to person, place, and time. He appears well-developed and well-nourished. No distress.  HENT:  Head: Normocephalic and atraumatic.  Eyes: Conjunctivae and EOM are normal. Pupils are equal, round, and reactive to light.  Neck: Normal range of motion. Neck supple. Muscular tenderness present. No rigidity.  Cardiovascular: Normal rate and regular rhythm.   Pulmonary/Chest: Effort normal and breath sounds normal.  Abdominal: Soft. There is no tenderness.  Musculoskeletal:       Back:  Tenderness on palpation and spasm noted to the upper back. Radial pulses 2+, adequate circulation, equal grips. Tender on palpation and range of motion to the right side of the neck.   Neurological: He is alert and oriented to person, place, and time. He has normal strength. No cranial nerve deficit or sensory deficit. Gait  normal.  Skin: Skin is warm and dry.  Psychiatric: He has a normal mood and affect. His behavior is normal.  Nursing note and vitals reviewed.   ED Course  Procedures (including critical care time) Labs Review Labs Reviewed - No data to display  Imaging Review Dg Cervical Spine Complete  02/25/2016  CLINICAL DATA:  Neck pain.  Pain in back of skull for 4 days. EXAM: CERVICAL SPINE - COMPLETE 4+ VIEW COMPARISON:  None FINDINGS: Normal alignment of the cervical spine. The vertebral body heights are well preserved. There is no fracture or subluxation identified.  No radio-opaque foreign body or soft tissue calcification. IMPRESSION: 1. Normal appearance of the cervical spine. Electronically Signed   By: Kerby Moors M.D.   On: 02/25/2016 22:07   I have personally reviewed and evaluated these images as part of my medical decision-making.   MDM  40 y.o. male with upper back pain and right side neck pain stable for d/c without focal neuro deficits. Patient very concerned about tick bites and request antibiotic. Will treat muscle spasm with Flexeril and NSAIDS and will give Rx for Doxycycline. Discussed with the patient clinical findings and plan of care and all questioned fully answered. He will return if any problems arise.   Final diagnoses:  Muscle spasm of back       [facility], NP 02/25/16 2315  Virgel Manifold, MD 02/29/16 1349

## 2016-02-25 NOTE — ED Notes (Addendum)
Pt reports upper back pain (in between shoulder blades) that radiates into his neck. Pt states he has pulled multiple ticks of of him and is concerned he may have some kind of an infection from a tick bite (per [employer]).

## 2018-12-15 ENCOUNTER — Encounter (HOSPITAL_COMMUNITY): Payer: Self-pay

## 2018-12-15 ENCOUNTER — Other Ambulatory Visit: Payer: Self-pay

## 2018-12-15 ENCOUNTER — Emergency Department (HOSPITAL_COMMUNITY): Payer: BLUE CROSS/BLUE SHIELD

## 2018-12-15 ENCOUNTER — Emergency Department (HOSPITAL_COMMUNITY)
Admission: EM | Admit: 2018-12-15 | Discharge: 2018-12-15 | Disposition: A | Discharge status: Home / Self Care | Payer: BLUE CROSS/BLUE SHIELD | Attending: Emergency Medicine | Admitting: Emergency Medicine

## 2018-12-15 DIAGNOSIS — F17210 Nicotine dependence, cigarettes, uncomplicated: Secondary | ICD-10-CM | POA: Diagnosis not present

## 2018-12-15 DIAGNOSIS — Z79899 Other long term (current) drug therapy: Secondary | ICD-10-CM | POA: Diagnosis not present

## 2018-12-15 DIAGNOSIS — B349 Viral infection, unspecified: Secondary | ICD-10-CM | POA: Insufficient documentation

## 2018-12-15 DIAGNOSIS — R05 Cough: Secondary | ICD-10-CM | POA: Diagnosis present

## 2018-12-15 IMAGING — DX DG CHEST 2V
2 series · 2 of 2 positions shown · non-contrast
Comparison: [DATE]

CLINICAL DATA: Cough and fatigue

EXAM:
CHEST - 2 VIEW

[chest pa]
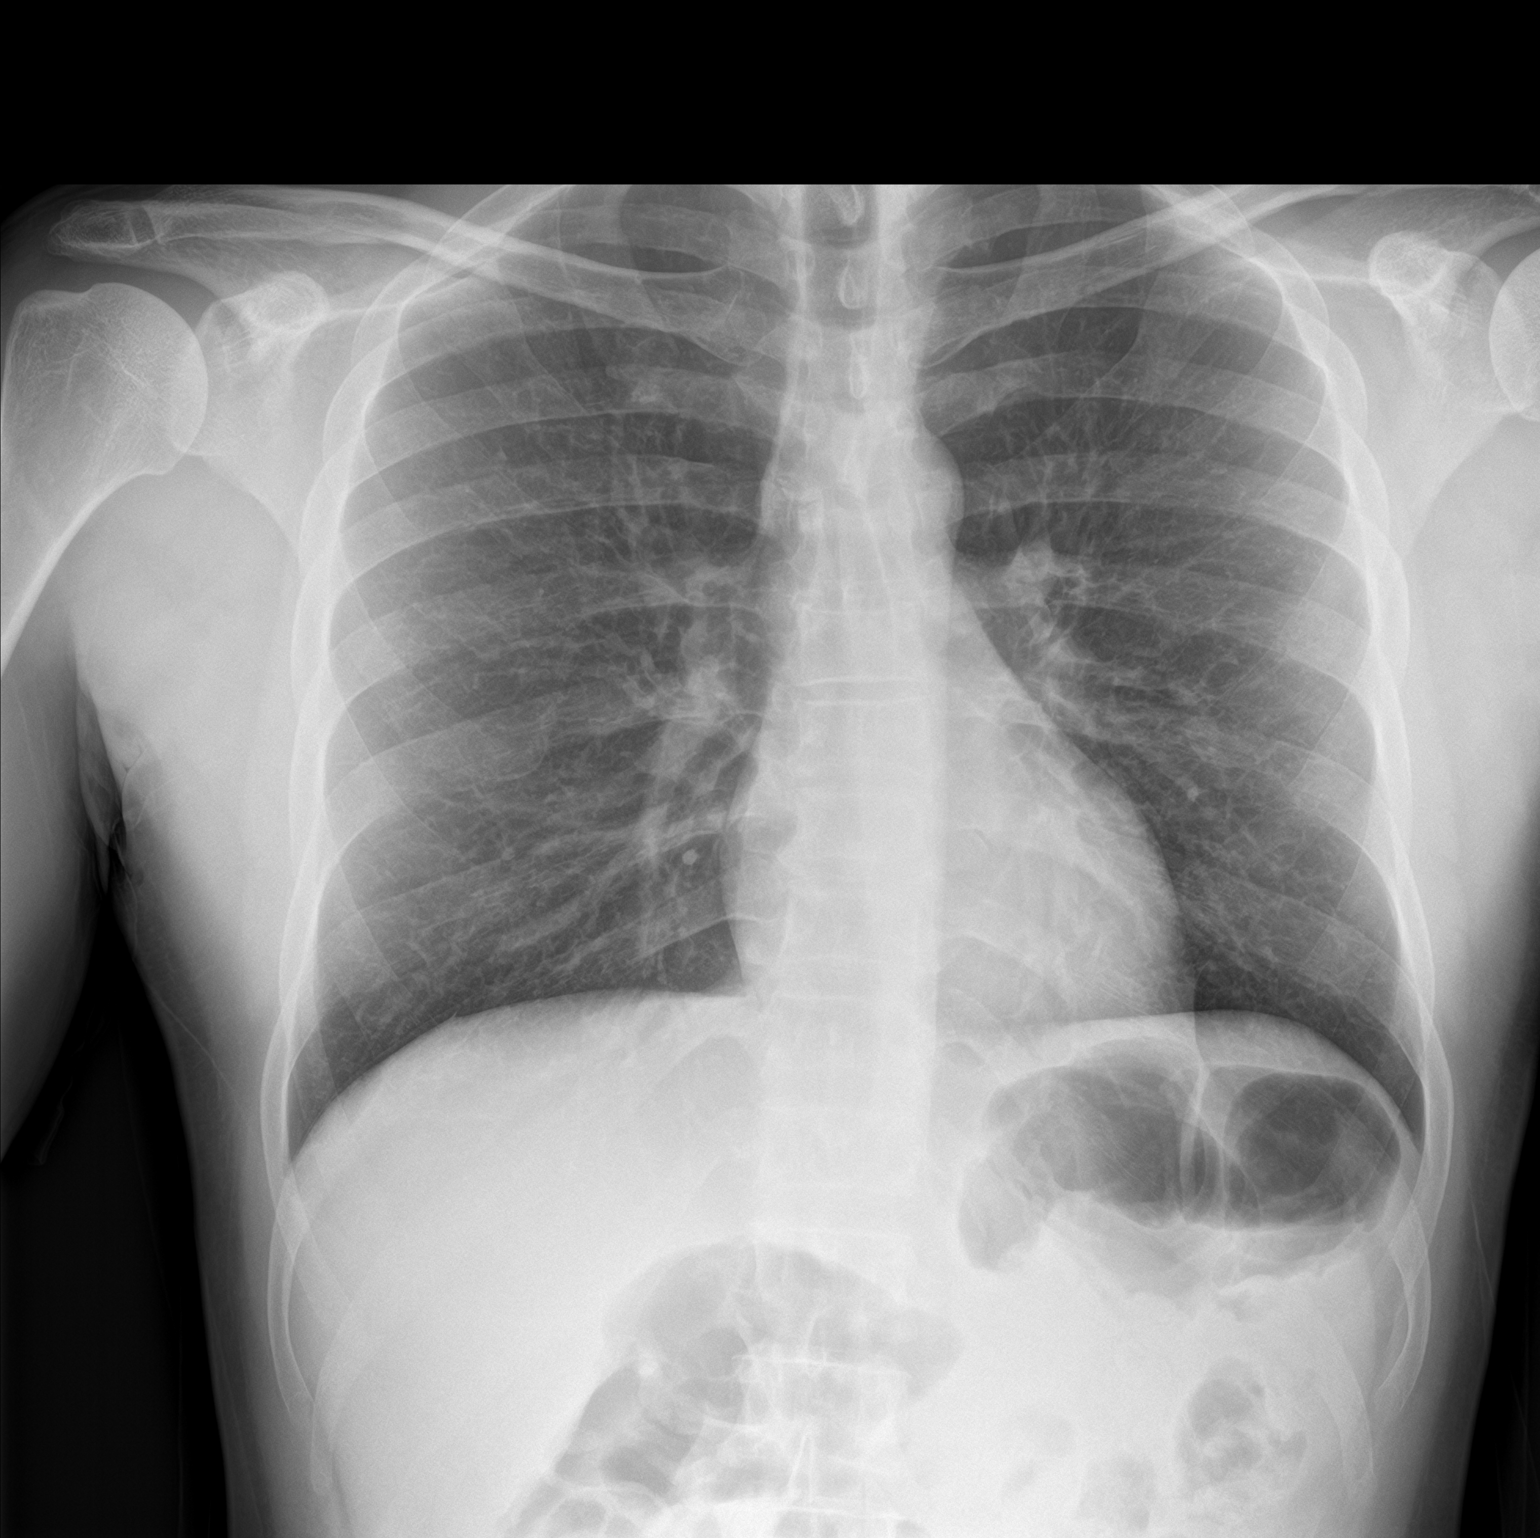

[chest lat]
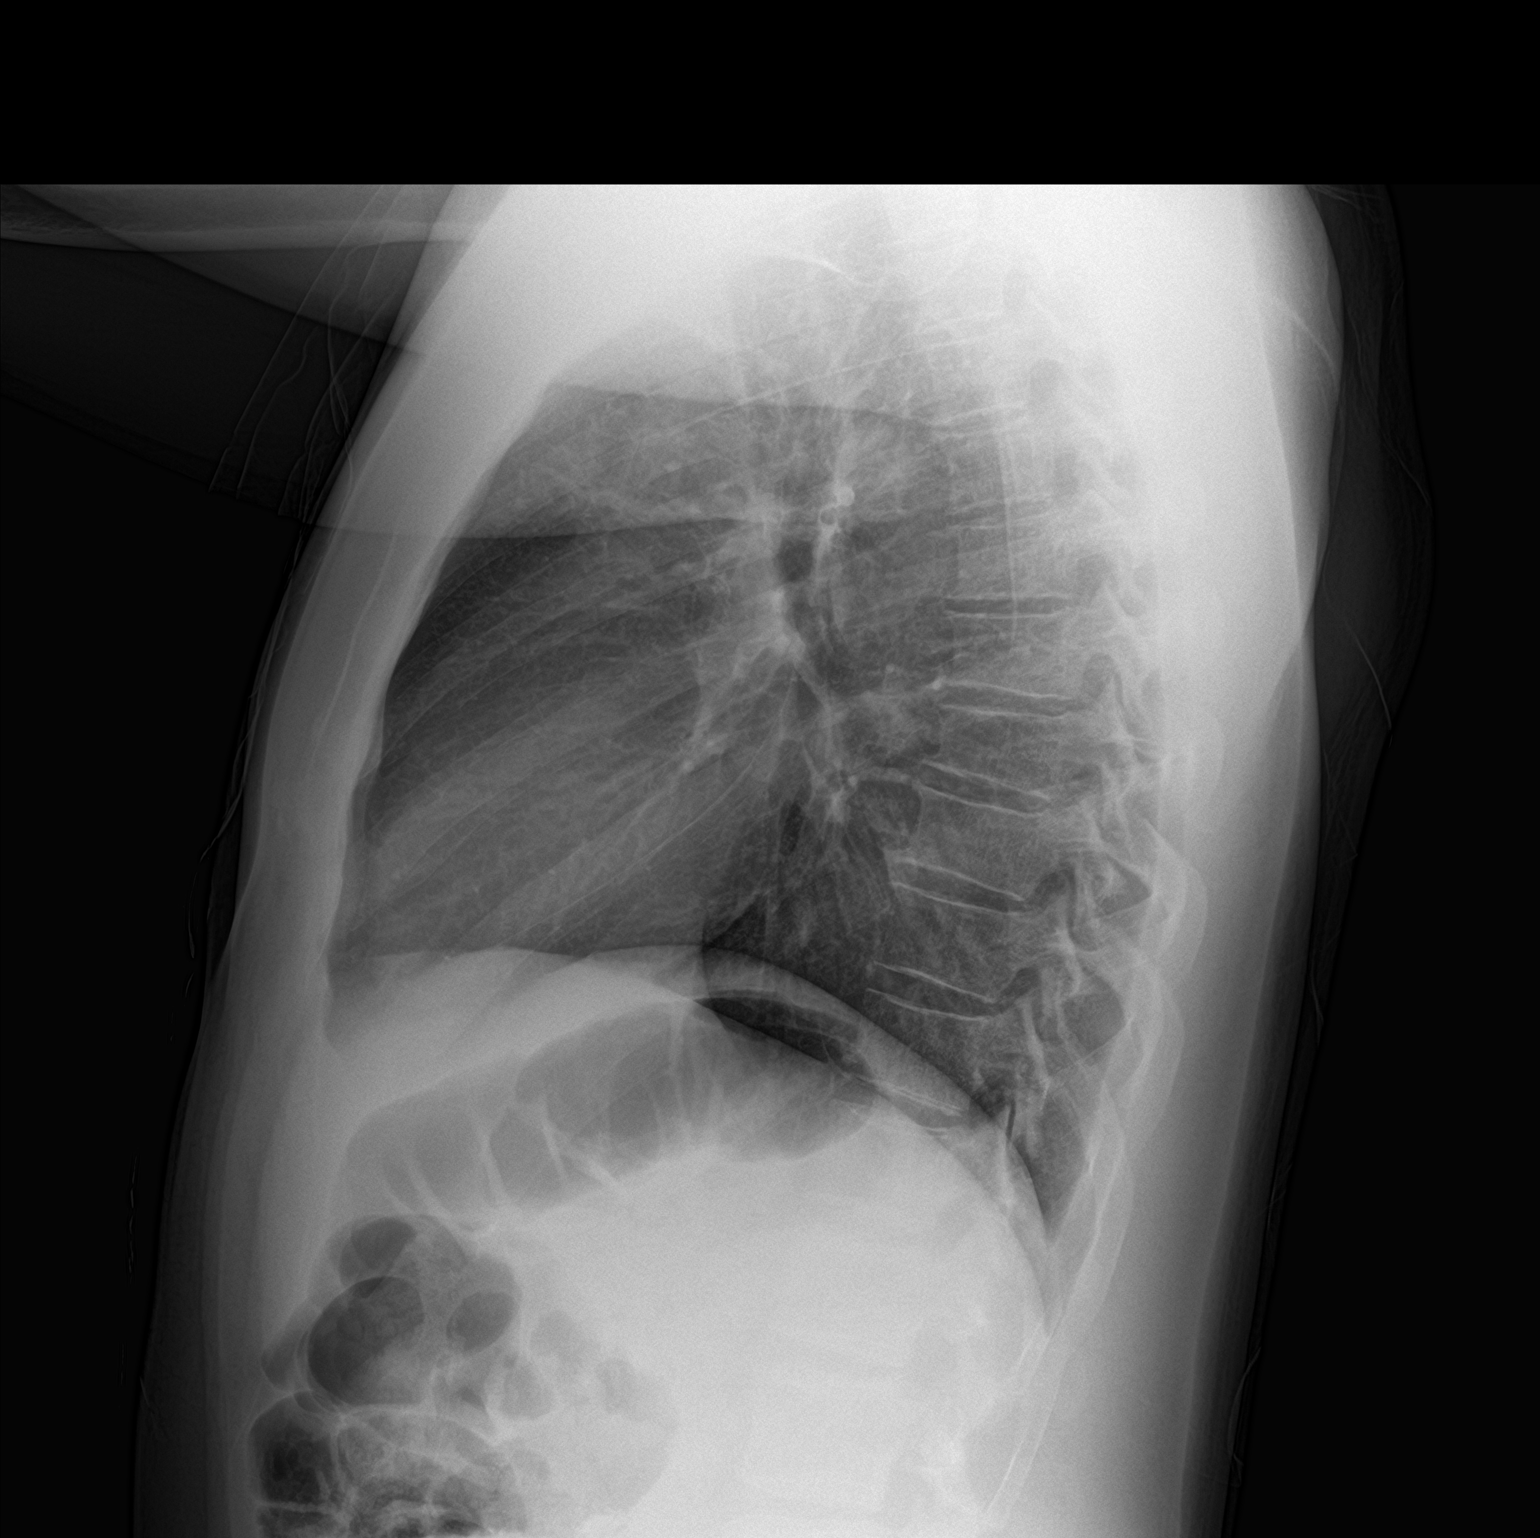

[2 of 2 positions shown; findings below may reference images not displayed]

FINDINGS: The heart size and mediastinal contours are within normal limits.
Both lungs are clear. Stable mild midthoracic wedging deformity.
IMPRESSION: No active cardiopulmonary disease.

## 2018-12-15 MED ORDER — OSELTAMIVIR PHOSPHATE 75 MG PO CAPS: 75.0000 mg | ORAL_CAPSULE | Freq: Two times a day (BID) | ORAL | 0 refills | Status: AC

## 2018-12-15 MED ORDER — BENZONATATE 100 MG PO CAPS: 100.0000 mg | ORAL_CAPSULE | Freq: Three times a day (TID) | ORAL | 0 refills | Status: DC

## 2018-12-15 MED ORDER — KETOROLAC TROMETHAMINE 60 MG/2ML IM SOLN
60.0000 mg | Freq: Once | INTRAMUSCULAR | Status: AC
  Administered 2018-12-15: 60 mg via INTRAMUSCULAR
  Filled 2018-12-15: qty 2

## 2018-12-15 NOTE — ED Notes (Signed)
ED Provider at bedside. 

## 2018-12-15 NOTE — Discharge Instructions (Signed)
Return if any problems.

## 2018-12-15 NOTE — ED Triage Notes (Signed)
Pt reports body aches, fatigue, and cough for 2 days

## 2018-12-15 NOTE — ED Notes (Signed)
Patient transported to X-ray 

## 2018-12-15 NOTE — ED Provider Notes (Signed)
[facility] Provider Note   CSN: [id number] Arrival date & time: 12/15/18  1714    History   Chief Complaint Chief Complaint  Patient presents with  . flu like symptoms    HPI Isaiah Warren is a 43 y.o. male.     The history is provided by the patient. No language interpreter was used.  Cough  Cough characteristics:  Productive Sputum characteristics:  Nondescript Severity:  Moderate Duration:  2 days Timing:  Constant Progression:  Worsening Chronicity:  New Smoker: yes   Context: upper respiratory infection   Relieved by:  Nothing Worsened by:  Nothing Ineffective treatments:  None tried Associated symptoms: headaches and shortness of breath   Pt complains of a cough and headache.  Pt reports his head hurts when he coughs.    History reviewed. No pertinent past medical history.  There are no active problems to display for this patient.   History reviewed. No pertinent surgical history.      Home Medications    Prior to Admission medications   Medication Sig Start Date End Date Taking? Authorizing Provider  amoxicillin (AMOXIL) 500 MG capsule Take 1 capsule (500 mg total) by mouth 3 (three) times daily. 01/14/15   Lily Kocher, PA-C  cyclobenzaprine (FLEXERIL) 10 MG tablet Take 1 tablet (10 mg total) by mouth 2 (two) times daily as needed for muscle spasms. 02/25/16   Ashley Murrain, NP  diclofenac (VOLTAREN) 50 MG EC tablet Take 1 tablet (50 mg total) by mouth 2 (two) times daily. 02/25/16   Ashley Murrain, NP  doxycycline (VIBRAMYCIN) 100 MG capsule Take 1 capsule (100 mg total) by mouth 2 (two) times daily. 02/25/16   Ashley Murrain, NP  HYDROcodone-acetaminophen (NORCO/VICODIN) 5-325 MG per tablet Take 1 tablet by mouth every 4 (four) hours as needed. 01/14/15   Lily Kocher, PA-C  ibuprofen (ADVIL,MOTRIN) 800 MG tablet Take 1 tablet (800 mg total) by mouth 3 (three) times daily. 01/14/15   Lily Kocher, PA-C    Family History No  family history on file.  Social History Social History   Tobacco Use  . Smoking status: Current Every Day Smoker    Packs/day: 0.50    Types: Cigarettes  Substance Use Topics  . Alcohol use: Not Currently    Alcohol/week: 2.0 standard drinks    Types: 2 Cans of beer per week    Comment: daily  . Drug use: No     Allergies   Patient has no known allergies.   Review of Systems Review of Systems  Respiratory: Positive for cough and shortness of breath.   Neurological: Positive for headaches.  All other systems reviewed and are negative.    Physical Exam Updated Vital Signs BP (!) 145/92 (BP Location: Right Arm)   Pulse 99   Temp 100 F (37.8 C) (Oral)   Resp 18   Ht 5' 9"  (1.753 m)   Wt 72.6 kg   SpO2 95%   BMI 23.63 kg/m   Physical Exam Vitals signs and nursing note reviewed.  Constitutional:      Appearance: He is well-developed.  HENT:     Head: Normocephalic.     Right Ear: Tympanic membrane normal.     Left Ear: Tympanic membrane normal.     Nose: Nose normal.     Mouth/Throat:     Mouth: Mucous membranes are moist.  Eyes:     Pupils: Pupils are equal, round, and reactive to light.  Neck:     Musculoskeletal: Normal range of motion.  Cardiovascular:     Rate and Rhythm: Normal rate.  Pulmonary:     Effort: Pulmonary effort is normal.  Abdominal:     General: There is no distension.  Musculoskeletal: Normal range of motion.  Neurological:     Mental Status: He is alert and oriented to person, place, and time.  Psychiatric:        Mood and Affect: Mood normal.      ED Treatments / Results  Labs (all labs ordered are listed, but only abnormal results are displayed) Labs Reviewed - No data to display  EKG None  Radiology Dg Chest 2 View  Result Date: 12/15/2018 CLINICAL DATA:  Cough and fatigue EXAM: CHEST - 2 VIEW COMPARISON:  12/12/2006 FINDINGS: The heart size and mediastinal contours are within normal limits. Both lungs are clear.  Stable mild midthoracic wedging deformity. IMPRESSION: No active cardiopulmonary disease. Electronically Signed   By: Donavan Foil M.D.   On: 12/15/2018 21:00    Procedures Procedures (including critical care time)  Medications Ordered in ED Medications  ketorolac (TORADOL) injection 60 mg (60 mg Intramuscular Given 12/15/18 2037)     Initial Impression / Assessment and Plan / ED Course  I have reviewed the triage vital signs and the nursing notes.  Pertinent labs & imaging results that were available during my care of the patient were reviewed by me and considered in my medical decision making (see chart for details).        Pt given torodol for headache.  Chest xray reviewed,  No pneumonia   Pt given rx for tessalon and tamiflu   Final Clinical Impressions(s) / ED Diagnoses   Final diagnoses:  Viral illness    ED Discharge Orders         Ordered    oseltamivir (TAMIFLU) 75 MG capsule  2 times daily     12/15/18 2126    benzonatate (TESSALON) 100 MG capsule  Every 8 hours     12/15/18 2126        An After Visit Summary was printed and given to the patient.    Sidney Ace 12/15/18 2126    Hayden Rasmussen, MD 12/16/18 [phone]

## 2019-08-14 ENCOUNTER — Encounter (HOSPITAL_COMMUNITY): Payer: Self-pay

## 2019-08-14 ENCOUNTER — Other Ambulatory Visit: Payer: Self-pay

## 2019-08-14 ENCOUNTER — Emergency Department (HOSPITAL_COMMUNITY)
Admission: EM | Admit: 2019-08-14 | Discharge: 2019-08-14 | Disposition: A | Discharge status: Home / Self Care | Payer: BLUE CROSS/BLUE SHIELD | Attending: Emergency Medicine | Admitting: Emergency Medicine

## 2019-08-14 DIAGNOSIS — K029 Dental caries, unspecified: Secondary | ICD-10-CM | POA: Insufficient documentation

## 2019-08-14 DIAGNOSIS — F17210 Nicotine dependence, cigarettes, uncomplicated: Secondary | ICD-10-CM | POA: Insufficient documentation

## 2019-08-14 MED ORDER — ACETAMINOPHEN 500 MG PO TABS
500.0000 mg | ORAL_TABLET | Freq: Once | ORAL | Status: AC
  Administered 2019-08-14: 500 mg via ORAL
  Filled 2019-08-14: qty 1

## 2019-08-14 MED ORDER — PENICILLIN V POTASSIUM 250 MG PO TABS
500.0000 mg | ORAL_TABLET | Freq: Once | ORAL | Status: AC
  Administered 2019-08-14: 500 mg via ORAL
  Filled 2019-08-14: qty 2

## 2019-08-14 MED ORDER — IBUPROFEN 400 MG PO TABS
600.0000 mg | ORAL_TABLET | Freq: Once | ORAL | Status: AC
  Administered 2019-08-14: 600 mg via ORAL
  Filled 2019-08-14: qty 2

## 2019-08-14 MED ORDER — PENICILLIN V POTASSIUM 500 MG PO TABS: 500.0000 mg | ORAL_TABLET | Freq: Four times a day (QID) | ORAL | 0 refills | Status: AC

## 2019-08-14 NOTE — ED Notes (Signed)
ED Provider at bedside. 

## 2019-08-14 NOTE — ED Provider Notes (Signed)
[facility] EMERGENCY DEPARTMENT Provider Note   CSN: [id number] Arrival date & time: 08/14/19  0506   Time seen 5:25 AM  History   Chief Complaint Chief Complaint  Patient presents with  . Dental Pain    HPI Isaiah Warren is a 43 y.o. male.     HPI patient states he has had a broken left upper tooth for about a year.  He states about 8 PM last night it started to get painful he took 1 Tylenol and it helped.  He states it woke him up at 3 AM and now his whole left face is painful.  He denies any fever.  He states he took 500 mg of Tylenol without relief.  He states he does not have a dentist.  PCP Patient, No Pcp Per   History reviewed. No pertinent past medical history.  There are no active problems to display for this patient.   History reviewed. No pertinent surgical history.      Home Medications    Prior to Admission medications   Medication Sig Start Date End Date Taking? Authorizing Provider  amoxicillin (AMOXIL) 500 MG capsule Take 1 capsule (500 mg total) by mouth 3 (three) times daily. 01/14/15   Lily Kocher, PA-C  benzonatate (TESSALON) 100 MG capsule Take 1 capsule (100 mg total) by mouth every 8 (eight) hours. 12/15/18   Fransico Meadow, PA-C  cyclobenzaprine (FLEXERIL) 10 MG tablet Take 1 tablet (10 mg total) by mouth 2 (two) times daily as needed for muscle spasms. 02/25/16   Ashley Murrain, NP  diclofenac (VOLTAREN) 50 MG EC tablet Take 1 tablet (50 mg total) by mouth 2 (two) times daily. 02/25/16   Ashley Murrain, NP  doxycycline (VIBRAMYCIN) 100 MG capsule Take 1 capsule (100 mg total) by mouth 2 (two) times daily. 02/25/16   Ashley Murrain, NP  HYDROcodone-acetaminophen (NORCO/VICODIN) 5-325 MG per tablet Take 1 tablet by mouth every 4 (four) hours as needed. 01/14/15   Lily Kocher, PA-C  ibuprofen (ADVIL,MOTRIN) 800 MG tablet Take 1 tablet (800 mg total) by mouth 3 (three) times daily. 01/14/15   Lily Kocher, PA-C  penicillin v potassium (VEETID) 500  MG tablet Take 1 tablet (500 mg total) by mouth 4 (four) times daily for 10 days. 08/14/19 08/24/19  Rolland Porter, MD    Family History No family history on file.  Social History Social History   Tobacco Use  . Smoking status: Current Every Day Smoker    Packs/day: 0.50    Types: Cigarettes  . Smokeless tobacco: Never Used  Substance Use Topics  . Alcohol use: Not Currently    Alcohol/week: 2.0 standard drinks    Types: 2 Cans of beer per week    Comment: daily  . Drug use: No     Allergies   Patient has no known allergies.   Review of Systems Review of Systems  All other systems reviewed and are negative.    Physical Exam Updated Vital Signs BP 135/85 (BP Location: Right Arm)   Pulse 78   Temp 98.2 F (36.8 C) (Oral)   Resp 15   Ht 5' 9"  (1.753 m)   Wt 77.1 kg   SpO2 99%   BMI 25.10 kg/m   Physical Exam Nursing note reviewed.  Constitutional:      Appearance: Normal appearance.  HENT:     Head: Normocephalic and atraumatic.     Comments: No facial swelling seen    Nose: Nose  normal.     Mouth/Throat:     Mouth: Mucous membranes are moist.     Pharynx: No oropharyngeal exudate or posterior oropharyngeal erythema.   Eyes:     Extraocular Movements: Extraocular movements intact.     Conjunctiva/sclera: Conjunctivae normal.     Pupils: Pupils are equal, round, and reactive to light.  Neck:     Musculoskeletal: Normal range of motion.  Cardiovascular:     Rate and Rhythm: Normal rate.  Pulmonary:     Effort: Pulmonary effort is normal. No respiratory distress.  Musculoskeletal: Normal range of motion.        General: No deformity.  Skin:    General: Skin is warm and dry.     Findings: No erythema.  Neurological:     General: No focal deficit present.     Mental Status: He is alert.     Cranial Nerves: No cranial nerve deficit.  Psychiatric:        Mood and Affect: Mood normal.        Behavior: Behavior normal.        Thought Content: Thought  content normal.      ED Treatments / Results  Labs (all labs ordered are listed, but only abnormal results are displayed) Labs Reviewed - No data to display  EKG None  Radiology No results found.  Procedures Procedures (including critical care time)  Medications Ordered in ED Medications  acetaminophen (TYLENOL) tablet 500 mg (has no administration in time range)  ibuprofen (ADVIL) tablet 600 mg (has no administration in time range)  penicillin v potassium (VEETID) tablet 500 mg (has no administration in time range)     Initial Impression / Assessment and Plan / ED Course  I have reviewed the triage vital signs and the nursing notes.  Pertinent labs & imaging results that were available during my care of the patient were reviewed by me and considered in my medical decision making (see chart for details).        Patient only took 1 Tylenol almost 3 hours ago.  He was given an additional Tylenol plus ibuprofen.  He was advised to use Anbesol on the tooth.  He was started on Pen-Vee K.  We also discussed the need to see a dentist for definitive care of his dental problems.  Final Clinical Impressions(s) / ED Diagnoses   Final diagnoses:  Pain due to dental caries    ED Discharge Orders         Ordered    penicillin v potassium (VEETID) 500 MG tablet  4 times daily     08/14/19 0541        OTC ibuprofen and acetaminophen  Plan discharge  Rolland Porter, MD, Barbette Or, MD 08/14/19 [phone]

## 2019-08-14 NOTE — ED Triage Notes (Signed)
Awoke with pain to left upper molar, states is a broken off tooth.

## 2019-08-14 NOTE — Discharge Instructions (Signed)
Use ice packs on your face.  Take ibuprofen 600 mg plus acetaminophen 1000 mg every 6 hours for pain.  Use Anbesol over-the-counter for pain.  Take the antibiotics until gone.  Look at the dental resource guide to try to find a dentist to do definitive care of your tooth.  Sometimes the denture stores in [place] will pull teeth at a reduced rate.

## 2020-03-21 ENCOUNTER — Other Ambulatory Visit: Payer: Self-pay

## 2020-03-21 ENCOUNTER — Encounter (HOSPITAL_COMMUNITY): Payer: Self-pay | Admitting: *Deleted

## 2020-03-21 ENCOUNTER — Emergency Department (HOSPITAL_COMMUNITY)
Admission: EM | Admit: 2020-03-21 | Discharge: 2020-03-21 | Disposition: A | Discharge status: Home / Self Care | Payer: No Typology Code available for payment source | Attending: Emergency Medicine | Admitting: Emergency Medicine

## 2020-03-21 ENCOUNTER — Emergency Department (HOSPITAL_COMMUNITY): Payer: No Typology Code available for payment source

## 2020-03-21 DIAGNOSIS — R0789 Other chest pain: Secondary | ICD-10-CM | POA: Insufficient documentation

## 2020-03-21 DIAGNOSIS — R079 Chest pain, unspecified: Secondary | ICD-10-CM | POA: Diagnosis present

## 2020-03-21 DIAGNOSIS — F17210 Nicotine dependence, cigarettes, uncomplicated: Secondary | ICD-10-CM | POA: Diagnosis not present

## 2020-03-21 LAB — BASIC METABOLIC PANEL
Anion gap: 9 (ref 5–15)
BUN: 16 mg/dL (ref 6–20)
CO2: 23 mmol/L (ref 22–32)
Calcium: 9.3 mg/dL (ref 8.9–10.3)
Chloride: 106 mmol/L (ref 98–111)
Creatinine, Ser: 1.01 mg/dL (ref 0.61–1.24)
GFR calc Af Amer: >60 mL/min (ref >60)
GFR calc non Af Amer: >60 mL/min (ref >60)
Glucose, Bld: 143 mg/dL — ABNORMAL HIGH (ref 70–99)
Potassium: 3.5 mmol/L (ref 3.5–5.1)
Sodium: 138 mmol/L (ref 135–145)

## 2020-03-21 LAB — CBC
HCT: 35.5 % — ABNORMAL LOW (ref 39.0–52.0)
Hemoglobin: 12.3 g/dL — ABNORMAL LOW (ref 13.0–17.0)
MCH: 26.6 pg (ref 26.0–34.0)
MCHC: 34.6 g/dL (ref 30.0–36.0)
MCV: 76.7 fL — ABNORMAL LOW (ref 80.0–100.0)
Platelets: 298 10*3/uL (ref 150–400)
RBC: 4.63 MIL/uL (ref 4.22–5.81)
RDW: 14.2 % (ref 11.5–15.5)
WBC: 7 10*3/uL (ref 4.0–10.5)
nRBC: 0 % (ref 0.0–0.2)

## 2020-03-21 LAB — TROPONIN I (HIGH SENSITIVITY)
Troponin I (High Sensitivity): <2 ng/L (ref <18)
Troponin I (High Sensitivity): <2 ng/L (ref <18)

## 2020-03-21 IMAGING — DX DG CHEST 2V
2 series · 2 of 2 positions shown · non-contrast
Comparison: [DATE].

CLINICAL DATA: Chest pain.

EXAM:
CHEST - 2 VIEW

[chest pa]
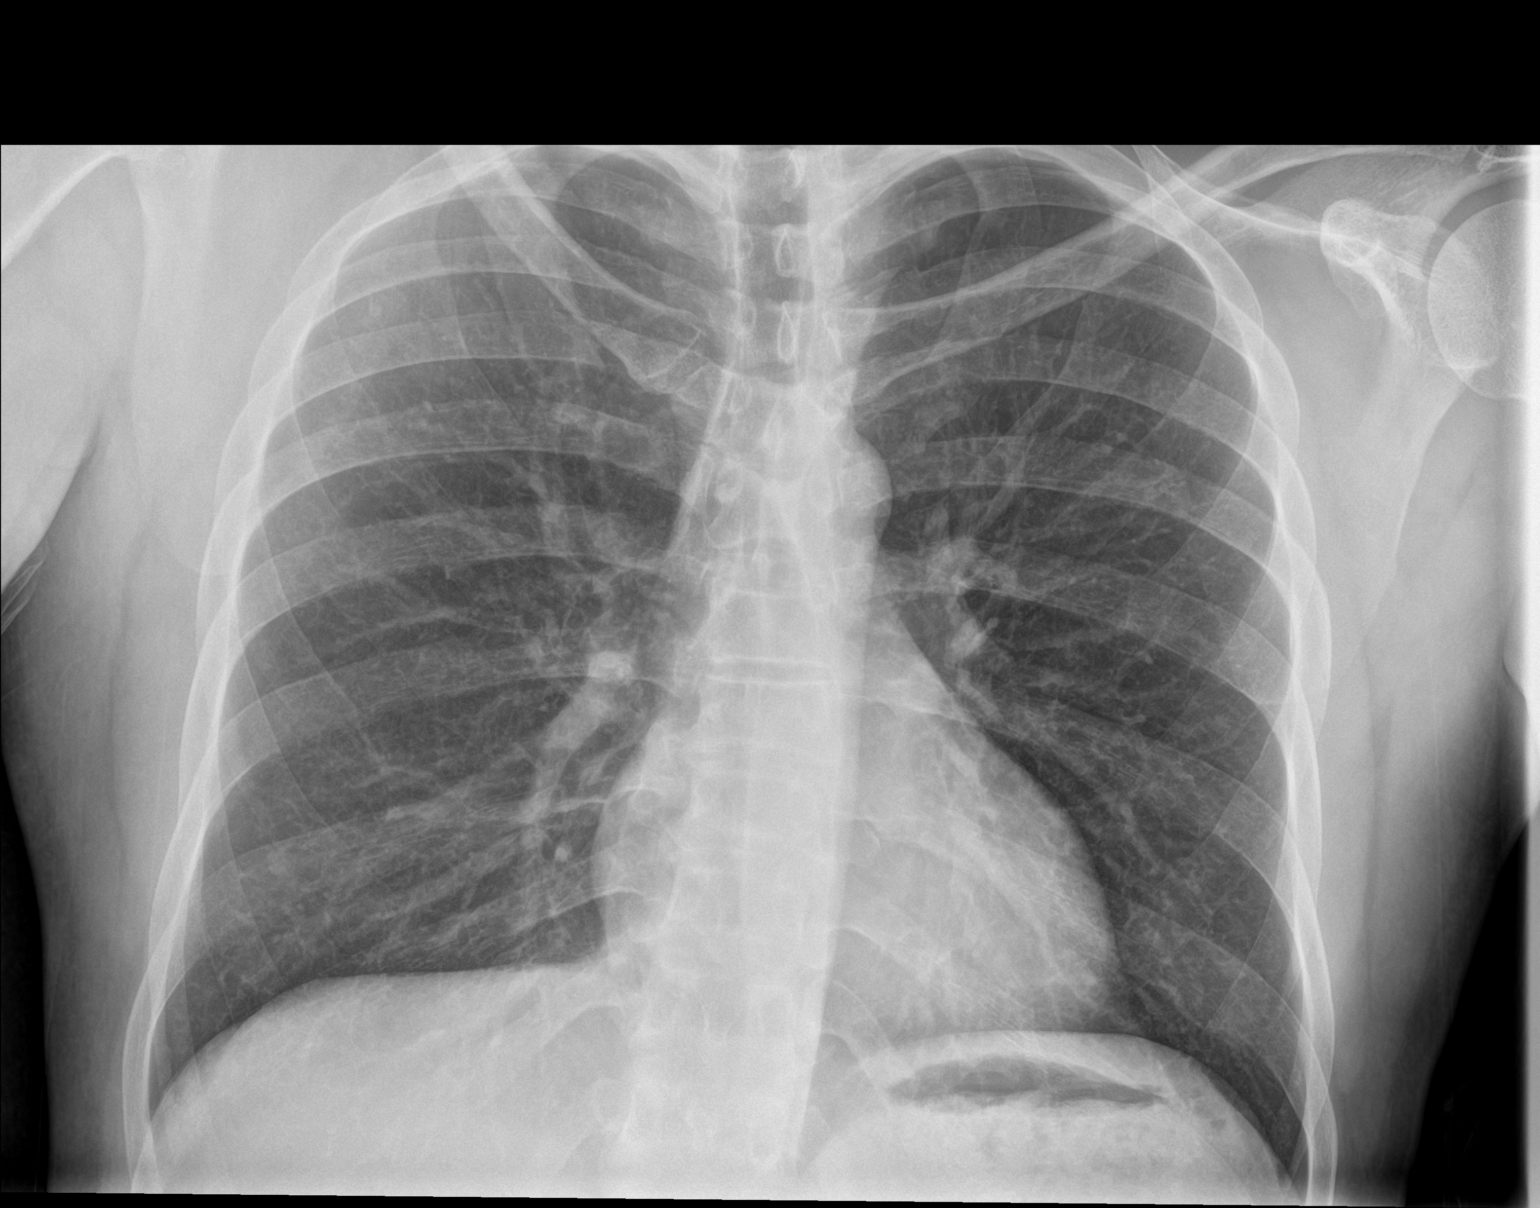

[chest lat]
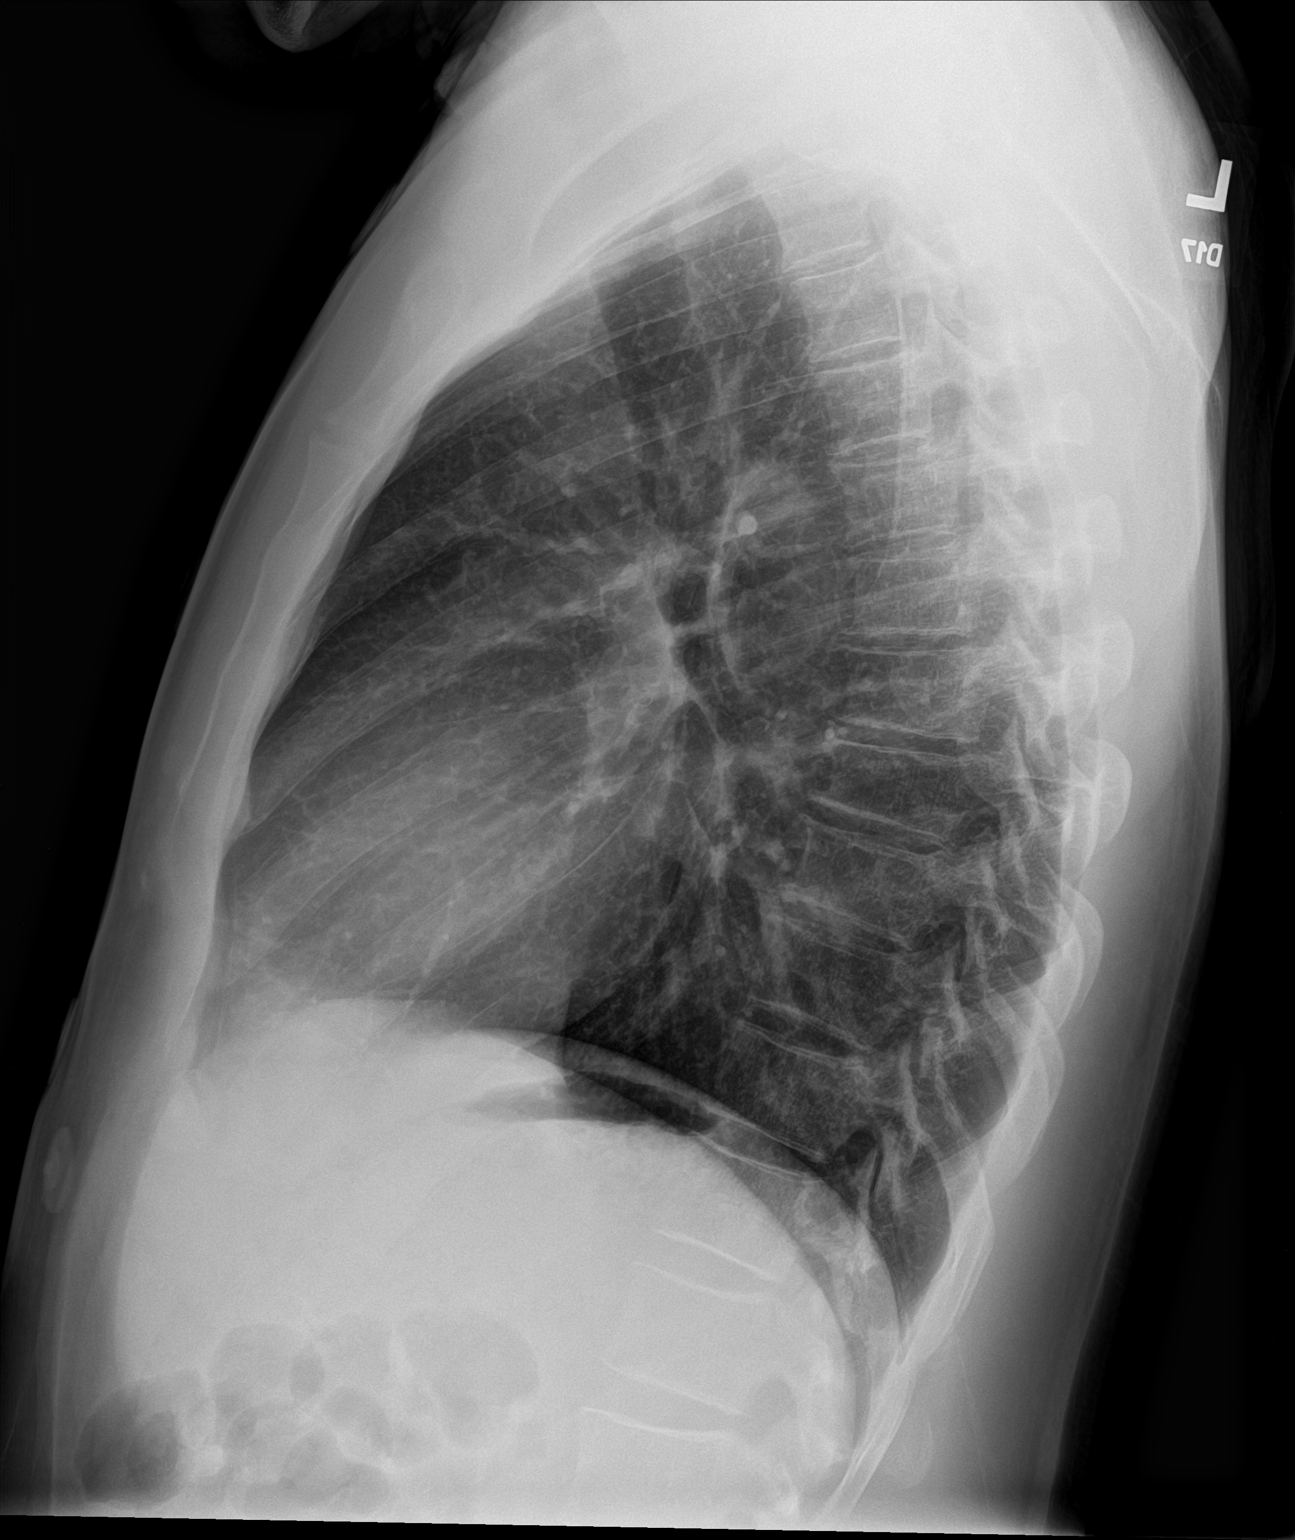

[2 of 2 positions shown; findings below may reference images not displayed]

FINDINGS: The heart size and mediastinal contours are within normal limits.
Both lungs are clear. The visualized skeletal structures are
unremarkable.
IMPRESSION: No active cardiopulmonary disease.

## 2020-03-21 MED ORDER — SODIUM CHLORIDE 0.9% FLUSH: 3.0000 mL | Freq: Once | INTRAVENOUS | Status: DC

## 2020-03-21 NOTE — ED Triage Notes (Signed)
Pt c/o mid chest pain along with diaphoresis that started about 2 hours ago while at work. Pt reports he had the same symptoms Saturday but they went away.

## 2020-03-21 NOTE — ED Provider Notes (Signed)
[facility] EMERGENCY DEPARTMENT Provider Note   CSN: [id number] Arrival date & time: 03/21/20  1239     History Chief Complaint  Patient presents with  . Chest Pain    Isaiah Warren is a 44 y.o. male with history of tobacco abuse presents for evaluation of acute onset, intermittent chest pain for 3 days.  He reports experiencing the pain initially on Saturday while driving.  The pain was a substernal chest heaviness/pressure that eased off after about 2 hours.  He did not experience the pain yesterday.  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  It lasted for several hours and was constant, resolved while he was in the waiting room.  He is currently asymptomatic.  Reports the pain will radiate from side to side.  He noted a little bit of diaphoresis when the pain first started today but this has since resolved.  Denies lightheadedness, syncope, nausea, vomiting, or shortness of breath.  He denies leg swelling, recent travel or surgeries, hemoptysis, or prior history of DVT or PE.  He is a current smoker of approximately half a pack of cigarettes daily, denies recreational drug use or excessive alcohol intake.  He does not have a strong family history of cardiac disease under the age of 80.  He does report that he has not been evaluated by a PCP in around 10 years.  He reports he has never experienced pain like this before and he has never experienced exertional chest pain.  The history is provided by the patient.       History reviewed. No pertinent past medical history.  There are no problems to display for this patient.   History reviewed. No pertinent surgical history.     No family history on file.  Social History   Tobacco Use  . Smoking status: Current Every Day Smoker    Packs/day: 0.25    Types: Cigarettes  . Smokeless tobacco: Never Used  . Tobacco comment: 2-3 cigarettes daily     Substance Use Topics  . Alcohol use: Yes    Alcohol/week: 2.0 standard drinks    Types: 2 Cans of beer per week    Comment: "couple beers on the weekend"  . Drug use: No    Home Medications Prior to Admission medications   Medication Sig Start Date End Date Taking? Authorizing Provider  amoxicillin (AMOXIL) 500 MG capsule Take 1 capsule (500 mg total) by mouth 3 (three) times daily. 01/14/15   Lily Kocher, PA-C  benzonatate (TESSALON) 100 MG capsule Take 1 capsule (100 mg total) by mouth every 8 (eight) hours. 12/15/18   Fransico Meadow, PA-C  cyclobenzaprine (FLEXERIL) 10 MG tablet Take 1 tablet (10 mg total) by mouth 2 (two) times daily as needed for muscle spasms. 02/25/16   Ashley Murrain, NP  diclofenac (VOLTAREN) 50 MG EC tablet Take 1 tablet (50 mg total) by mouth 2 (two) times daily. 02/25/16   Ashley Murrain, NP  doxycycline (VIBRAMYCIN) 100 MG capsule Take 1 capsule (100 mg total) by mouth 2 (two) times daily. 02/25/16   Ashley Murrain, NP  HYDROcodone-acetaminophen (NORCO/VICODIN) 5-325 MG per tablet Take 1 tablet by mouth every 4 (four) hours as needed. 01/14/15   Lily Kocher, PA-C  ibuprofen (ADVIL,MOTRIN) 800 MG tablet Take 1 tablet (800 mg total) by mouth 3 (three) times daily. 01/14/15   Lily Kocher, PA-C  Allergies    Patient has no known allergies.  Review of Systems   Review of Systems  Constitutional: Positive for diaphoresis. Negative for chills and fever.  Respiratory: Negative for shortness of breath.   Cardiovascular: Positive for chest pain. Negative for leg swelling.  Gastrointestinal: Negative for abdominal distention, abdominal pain, nausea and vomiting.  Neurological: Negative for syncope and light-headedness.  All other systems reviewed and are negative.   Physical Exam Updated Vital Signs BP 119/86   Pulse 63   Temp 98.5 F (36.9 C) (Oral)   Resp 17   Ht 5' 9"  (1.753 m)   Wt 81.6 kg   SpO2 100%   BMI 26.58 kg/m   Physical Exam Vitals  and nursing note reviewed.  Constitutional:      General: He is not in acute distress.    Appearance: He is well-developed.  HENT:     Head: Normocephalic and atraumatic.  Eyes:     General:        Right eye: No discharge.        Left eye: No discharge.     Conjunctiva/sclera: Conjunctivae normal.  Neck:     Vascular: No JVD.     Trachea: No tracheal deviation.  Cardiovascular:     Rate and Rhythm: Normal rate and regular rhythm.     Comments: 2+ radial and DP/PT pulses bilaterally, Homans sign absent bilaterally, no lower extremity edema, no palpable cords, compartments are soft  Pulmonary:     Effort: Pulmonary effort is normal.     Comments: Speaking in full sentences without difficulty, SPO2 saturations 99% on room air Chest:     Chest wall: No tenderness or edema.  Abdominal:     General: There is no distension.     Palpations: Abdomen is soft.  Musculoskeletal:     Right lower leg: No tenderness. No edema.     Left lower leg: No tenderness. No edema.  Skin:    General: Skin is warm and dry.     Findings: No erythema.  Neurological:     Mental Status: He is alert.  Psychiatric:        Behavior: Behavior normal.     ED Results / Procedures / Treatments   Labs (all labs ordered are listed, but only abnormal results are displayed) Labs Reviewed  BASIC METABOLIC PANEL - Abnormal; Notable for the following components:      Result Value   Glucose, Bld 143 (*)    All other components within normal limits  CBC - Abnormal; Notable for the following components:   Hemoglobin 12.3 (*)    HCT 35.5 (*)    MCV 76.7 (*)    All other components within normal limits  TROPONIN I (HIGH SENSITIVITY)  TROPONIN I (HIGH SENSITIVITY)    EKG EKG Interpretation  Date/Time:  Monday Mar 21 2020 12:54:20 EDT Ventricular Rate:  89 PR Interval:  162 QRS Duration: 80 QT Interval:  368 QTC Calculation: 447 R Axis:   51 Text Interpretation: Normal sinus rhythm Normal ECG No old  tracing to compare Confirmed by Daleen Bo [phone]) on 03/21/2020 1:28:15 PM   Radiology DG Chest 2 View  Result Date: 03/21/2020 CLINICAL DATA:  Chest pain. EXAM: CHEST - 2 VIEW COMPARISON:  December 15, 2018. FINDINGS: The heart size and mediastinal contours are within normal limits. Both lungs are clear. The visualized skeletal structures are unremarkable. IMPRESSION: No active cardiopulmonary disease. Electronically Signed   By: Bobbe Medico.D.  On: 03/21/2020 13:21    Procedures Procedures (including critical care time)  Medications Ordered in ED Medications  sodium chloride flush (NS) 0.9 % injection 3 mL (has no administration in time range)    ED Course  I have reviewed the triage vital signs and the nursing notes.  Pertinent labs & imaging results that were available during my care of the patient were reviewed by me and considered in my medical decision making (see chart for details).    MDM Rules/Calculators/A&P                      Patient presenting for evaluation of intermittent substernal chest pains.  He is afebrile, vital signs are stable, he is nontoxic in appearance.  He is chest pain-free on my assessment.  Pain is not pleuritic, exertional, or reproducible on palpation.  His initial EKG shows no acute ischemic abnormalities.  Chest x-ray shows no acute cardiopulmonary abnormalities with no evidence of edema, consolidation, cardiomegaly, or pneumothorax.  Lab work reviewed and interpreted by myself shows no leukocytosis, mild anemia, no metabolic derangements, no renal insufficiency.  Serial troponins are negative.  His pain is fairly atypical for ACS/MI and with 2 - troponins I am reassured that he is not experiencing an acute coronary event.  Doubt dissection, cardiac tamponade, esophageal rupture, CHF, pneumonia, or PE.  Abdomen is soft and nontender.  Repeat EKG obtained which shows no acute changes.  Recommend follow-up with a PCP or cardiology on an  outpatient basis for reevaluation of symptoms and for further evaluation and management.  No further emergent work-up required at this time.  Discussed strict ED return precautions. Patient verbalized understanding of and agreement with plan and is safe for discharge home at this time.    Final Clinical Impression(s) / ED Diagnoses Final diagnoses:  Atypical chest pain    Rx / DC Orders ED Discharge Orders         Ordered    Ambulatory referral to Cardiology     03/21/20 1803           Debroah Baller 03/21/20 1852    Maudie Flakes, MD 03/25/20 [phone]

## 2020-03-21 NOTE — Discharge Instructions (Signed)
Your work-up today was reassuring.  No evidence of heart attack or blood clots in the lungs.  Follow-up with cardiology and PCP on an outpatient basis for reevaluation of your symptoms and to determine if you have any risk factors for cardiac disease.  Return to emergency department if any concerning signs or symptoms develop such as severe pain, pain with exertion, shortness of breath, loss of consciousness, vomiting, fevers

## 2020-04-12 ENCOUNTER — Encounter: Payer: Self-pay | Admitting: Cardiovascular Disease

## 2020-04-12 ENCOUNTER — Other Ambulatory Visit: Payer: Self-pay

## 2020-04-12 ENCOUNTER — Ambulatory Visit (INDEPENDENT_AMBULATORY_CARE_PROVIDER_SITE_OTHER): Payer: No Typology Code available for payment source | Admitting: Cardiovascular Disease

## 2020-04-12 VITALS — BP 122/80 | HR 86 | Ht 69.0 in | Wt 182.0 lb

## 2020-04-12 DIAGNOSIS — R079 Chest pain, unspecified: Secondary | ICD-10-CM | POA: Diagnosis not present

## 2020-04-12 NOTE — Patient Instructions (Signed)
Medication Instructions:  Your physician recommends that you continue on your current medications as directed. Please refer to the Current Medication list given to you today.  *If you need a refill on your cardiac medications before your next appointment, please call your pharmacy*   Lab Work: None today If you have labs (blood work) drawn today and your tests are completely normal, you will receive your results only by: Marland Kitchen MyChart Message (if you have MyChart) OR . A paper copy in the mail If you have any lab test that is abnormal or we need to change your treatment, we will call you to review the results.   Testing/Procedures: Your physician has requested that you have an exercise tolerance test. For further information please visit [email or web address]. Please also follow instruction sheet, as given.     Follow-Up: At [facility], you and your health needs are our priority.  As part of our continuing mission to provide you with exceptional heart care, we have created designated Provider Care Teams.  These Care Teams include your primary Cardiologist (physician) and Advanced Practice Providers (APPs -  Physician Assistants and Nurse Practitioners) who all work together to provide you with the care you need, when you need it.  We recommend signing up for the patient portal called "MyChart".  Sign up information is provided on this After Visit Summary.  MyChart is used to connect with patients for Virtual Visits (Telemedicine).  Patients are able to view lab/test results, encounter notes, upcoming appointments, etc.  Non-urgent messages can be sent to your provider as well.   To learn more about what you can do with MyChart, go to [email or web address].    Your next appointment: As needed with Dr.Nishan    We will call you with results.      Thank you for choosing [facility] !

## 2020-04-12 NOTE — Progress Notes (Signed)
CARDIOLOGY CONSULT NOTE       Patient ID: Isaiah Warren MRN: [id number] DOB/AGE: 03-01-1976 44 y.o.  Admit date: (Not on file) Referring Physician: [facility]  Primary Physician: Patient, No Pcp Per Primary Cardiologist: New Reason for Consultation: Chest pain   Active Problems:   * No active hospital problems. *   HPI:  44 y.o. seen in [facility] 03/21/20 for atypical chest pain 03/21/20 He is a current smoker. Pain for 3 days. Started while Driving Substernal pressure lasting about 2 hours Was ok for 24 hours then had more pain following morning operating machinery at work. This pain was more severe lasted hours and resolved spontaneously when in ER waiting area Associated with mild diaphoresis Has not seen PCP in 10 years Family history is positive for premature CAD  ECG was normal Troponin negative x 2 despite prolonged pain CXR NAD   Has been ok since ER visit. 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 History of chest pain     Family History  Problem Relation Age of Onset  . Throat cancer Mother   . Diabetes Father   . Prostate cancer Father   . Hypertension Father     Social History   Socioeconomic History  . Marital status: Single    Spouse name: Not on file  . Number of children: Not on file  . Years of education: Not on file  . Highest education level: Not on file  Occupational History  . Not on file  Tobacco Use  . Smoking status: Current Every Day Smoker    Packs/day: 0.25    Types: Cigarettes  . Smokeless tobacco: Never Used  . Tobacco comment: 2-3 cigarettes daily   Vaping Use  . Vaping Use: Never used  Substance and  Sexual Activity  . Alcohol use: Yes    Alcohol/week: 2.0 standard drinks    Types: 2 Cans of beer per week    Comment: "couple beers on the weekend"  . Drug use: No  . Sexual activity: Not on file  Other Topics Concern  . Not on file  Social History Narrative  . Not on file   Social Determinants of Health   Financial Resource Strain:   . Difficulty of Paying Living Expenses:   Food Insecurity:   . Worried About Charity fundraiser in the Last Year:   . Arboriculturist in the Last Year:   Transportation Needs:   . Film/video editor (Medical):   Marland Kitchen Lack of Transportation (Non-Medical):   Physical Activity:   . Days of Exercise per Week:   . Minutes of Exercise per Session:   Stress:   . Feeling of Stress :   Social Connections:   . Frequency of Communication with Friends and Family:   . Frequency of Social Gatherings with Friends and Family:   . Attends Religious Services:   . Active Member of Clubs or Organizations:   . Attends Archivist Meetings:   Marland Kitchen Marital Status:   Intimate Partner Violence:   . Fear of Current or Ex-Partner:   . Emotionally Abused:   .  Physically Abused:   . Sexually Abused:     Past Surgical History:  Procedure Laterality Date  . none       No current outpatient medications on file.    Physical Exam: Blood pressure 122/80, pulse 86, height 5' 9"  (1.753 m), weight 182 lb (82.6 kg), SpO2 97 %.   Affect appropriate Healthy:  appears stated age 44: normal Neck supple with no adenopathy JVP normal no bruits no thyromegaly Lungs clear with no wheezing and good diaphragmatic motion Heart:  S1/S2 no murmur, no rub, gallop or click PMI normal Abdomen: benighn, BS positve, no tenderness, no AAA no bruit.  No HSM or HJR Distal pulses intact with no bruits No edema Neuro non-focal Skin warm and dry No muscular weakness   Labs:   Lab Results  Component Value Date   WBC 7.0 03/21/2020   HGB 12.3 (L) 03/21/2020   HCT  35.5 (L) 03/21/2020   MCV 76.7 (L) 03/21/2020   PLT 298 03/21/2020      Radiology: DG Chest 2 View  Result Date: 03/21/2020 CLINICAL DATA:  Chest pain. EXAM: CHEST - 2 VIEW COMPARISON:  December 15, 2018. FINDINGS: The heart size and mediastinal contours are within normal limits. Both lungs are clear. The visualized skeletal structures are unremarkable. IMPRESSION: No active cardiopulmonary disease. Electronically Signed   By: Marijo Conception M.D.   On: 03/21/2020 13:21    EKG: NSR rate 89 normal    ASSESSMENT AND PLAN:   1. Chest pain in setting of smoking and family history Normal ECG f/u ETT 2. Smoking : counseled on smoking cessation < 10 minutes normal lung exam and CXR 5.24.21  3. 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

## 2020-04-19 ENCOUNTER — Ambulatory Visit: Payer: No Typology Code available for payment source | Admitting: Cardiovascular Disease

## 2020-05-20 ENCOUNTER — Other Ambulatory Visit (HOSPITAL_COMMUNITY)
Admission: RE | Admit: 2020-05-20 | Discharge: 2020-05-20 | Disposition: A | Discharge status: Home / Self Care | Payer: No Typology Code available for payment source | Source: Ambulatory Visit | Attending: Cardiovascular Disease | Admitting: Cardiovascular Disease

## 2020-05-20 ENCOUNTER — Telehealth (HOSPITAL_COMMUNITY): Payer: Self-pay | Admitting: *Deleted

## 2020-05-20 NOTE — Telephone Encounter (Signed)
Patient missed Covid test appointment. Called patient to see if he could be here by 4:30 this afternoon, "No."  I  told patient he would need to call his dr.'s office and reschedule his appointment. Patient understood. Nothing further needed.

## 2020-05-23 ENCOUNTER — Encounter (HOSPITAL_COMMUNITY): Payer: No Typology Code available for payment source

## 2020-06-02 ENCOUNTER — Inpatient Hospital Stay (HOSPITAL_COMMUNITY): Admission: RE | Admit: 2020-06-02 | Payer: No Typology Code available for payment source | Source: Ambulatory Visit

## 2020-10-06 ENCOUNTER — Other Ambulatory Visit: Payer: Self-pay

## 2020-10-06 ENCOUNTER — Emergency Department (HOSPITAL_COMMUNITY): Payer: Medicaid Other

## 2020-10-06 ENCOUNTER — Emergency Department (HOSPITAL_COMMUNITY)
Admission: EM | Admit: 2020-10-06 | Discharge: 2020-10-06 | Disposition: A | Discharge status: Home / Self Care | Payer: Medicaid Other | Attending: Emergency Medicine | Admitting: Emergency Medicine

## 2020-10-06 ENCOUNTER — Encounter (HOSPITAL_COMMUNITY): Payer: Self-pay | Admitting: Emergency Medicine

## 2020-10-06 DIAGNOSIS — F17210 Nicotine dependence, cigarettes, uncomplicated: Secondary | ICD-10-CM | POA: Diagnosis not present

## 2020-10-06 DIAGNOSIS — R0789 Other chest pain: Secondary | ICD-10-CM

## 2020-10-06 DIAGNOSIS — R0602 Shortness of breath: Secondary | ICD-10-CM | POA: Diagnosis not present

## 2020-10-06 DIAGNOSIS — K047 Periapical abscess without sinus: Secondary | ICD-10-CM

## 2020-10-06 DIAGNOSIS — K219 Gastro-esophageal reflux disease without esophagitis: Secondary | ICD-10-CM | POA: Diagnosis not present

## 2020-10-06 LAB — BASIC METABOLIC PANEL
Anion gap: 6 (ref 5–15)
BUN: 10 mg/dL (ref 6–20)
CO2: 24 mmol/L (ref 22–32)
Calcium: 8.9 mg/dL (ref 8.9–10.3)
Chloride: 108 mmol/L (ref 98–111)
Creatinine, Ser: 0.94 mg/dL (ref 0.61–1.24)
GFR, Estimated: >60 mL/min (ref >60)
Glucose, Bld: 100 mg/dL — ABNORMAL HIGH (ref 70–99)
Potassium: 4 mmol/L (ref 3.5–5.1)
Sodium: 138 mmol/L (ref 135–145)

## 2020-10-06 LAB — CBC
HCT: 35.1 % — ABNORMAL LOW (ref 39.0–52.0)
Hemoglobin: 12.3 g/dL — ABNORMAL LOW (ref 13.0–17.0)
MCH: 27 pg (ref 26.0–34.0)
MCHC: 35 g/dL (ref 30.0–36.0)
MCV: 77.1 fL — ABNORMAL LOW (ref 80.0–100.0)
Platelets: 340 10*3/uL (ref 150–400)
RBC: 4.55 MIL/uL (ref 4.22–5.81)
RDW: 13.6 % (ref 11.5–15.5)
WBC: 6.4 10*3/uL (ref 4.0–10.5)
nRBC: 0 % (ref 0.0–0.2)

## 2020-10-06 LAB — TROPONIN I (HIGH SENSITIVITY)
Troponin I (High Sensitivity): <2 ng/L (ref <18)
Troponin I (High Sensitivity): <2 ng/L (ref <18)

## 2020-10-06 IMAGING — DX DG CHEST 2V
2 series · 2 of 2 positions shown · non-contrast
Comparison: [DATE].

CLINICAL DATA: Chest pain.

EXAM:
CHEST - 2 VIEW

[chest pa]
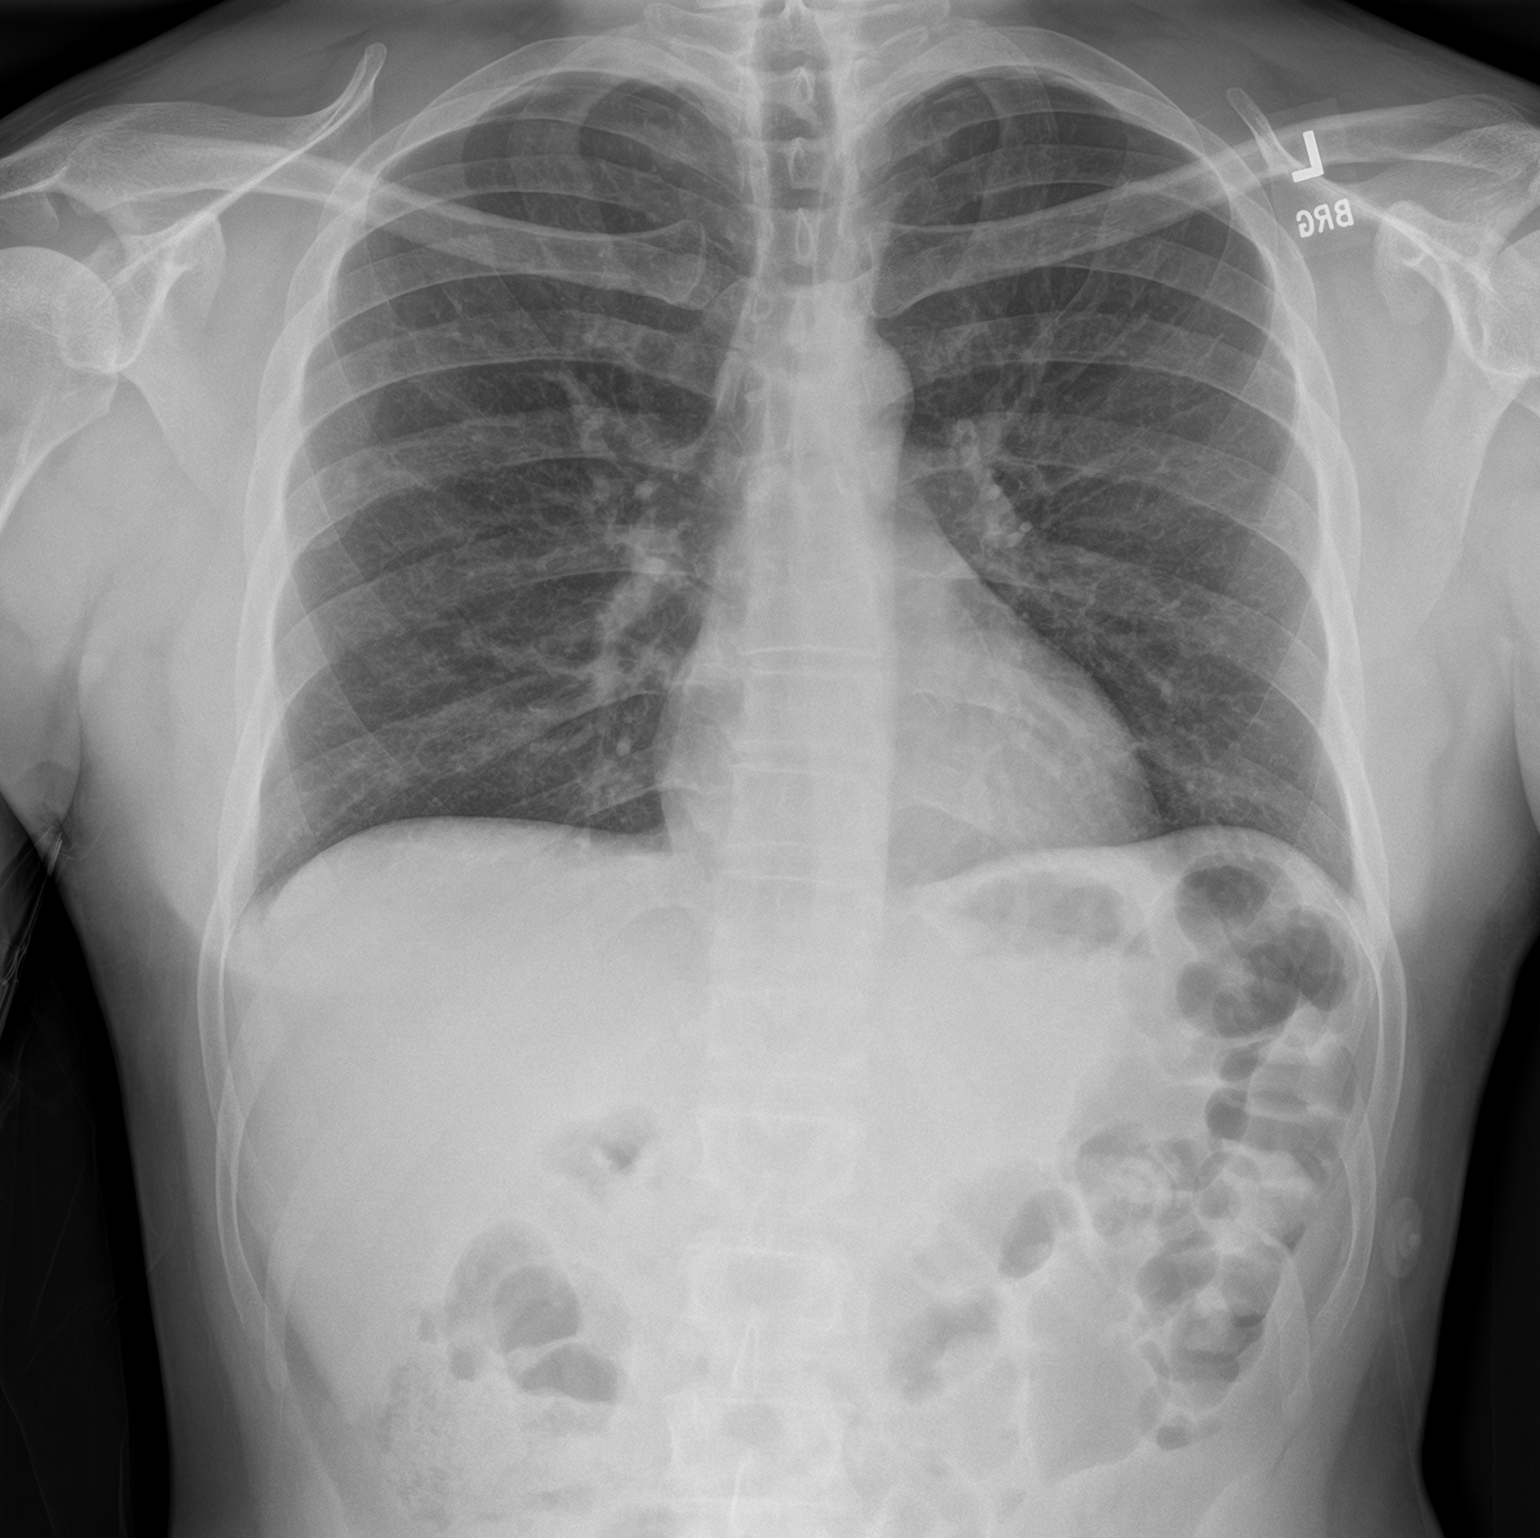

[chest lat]
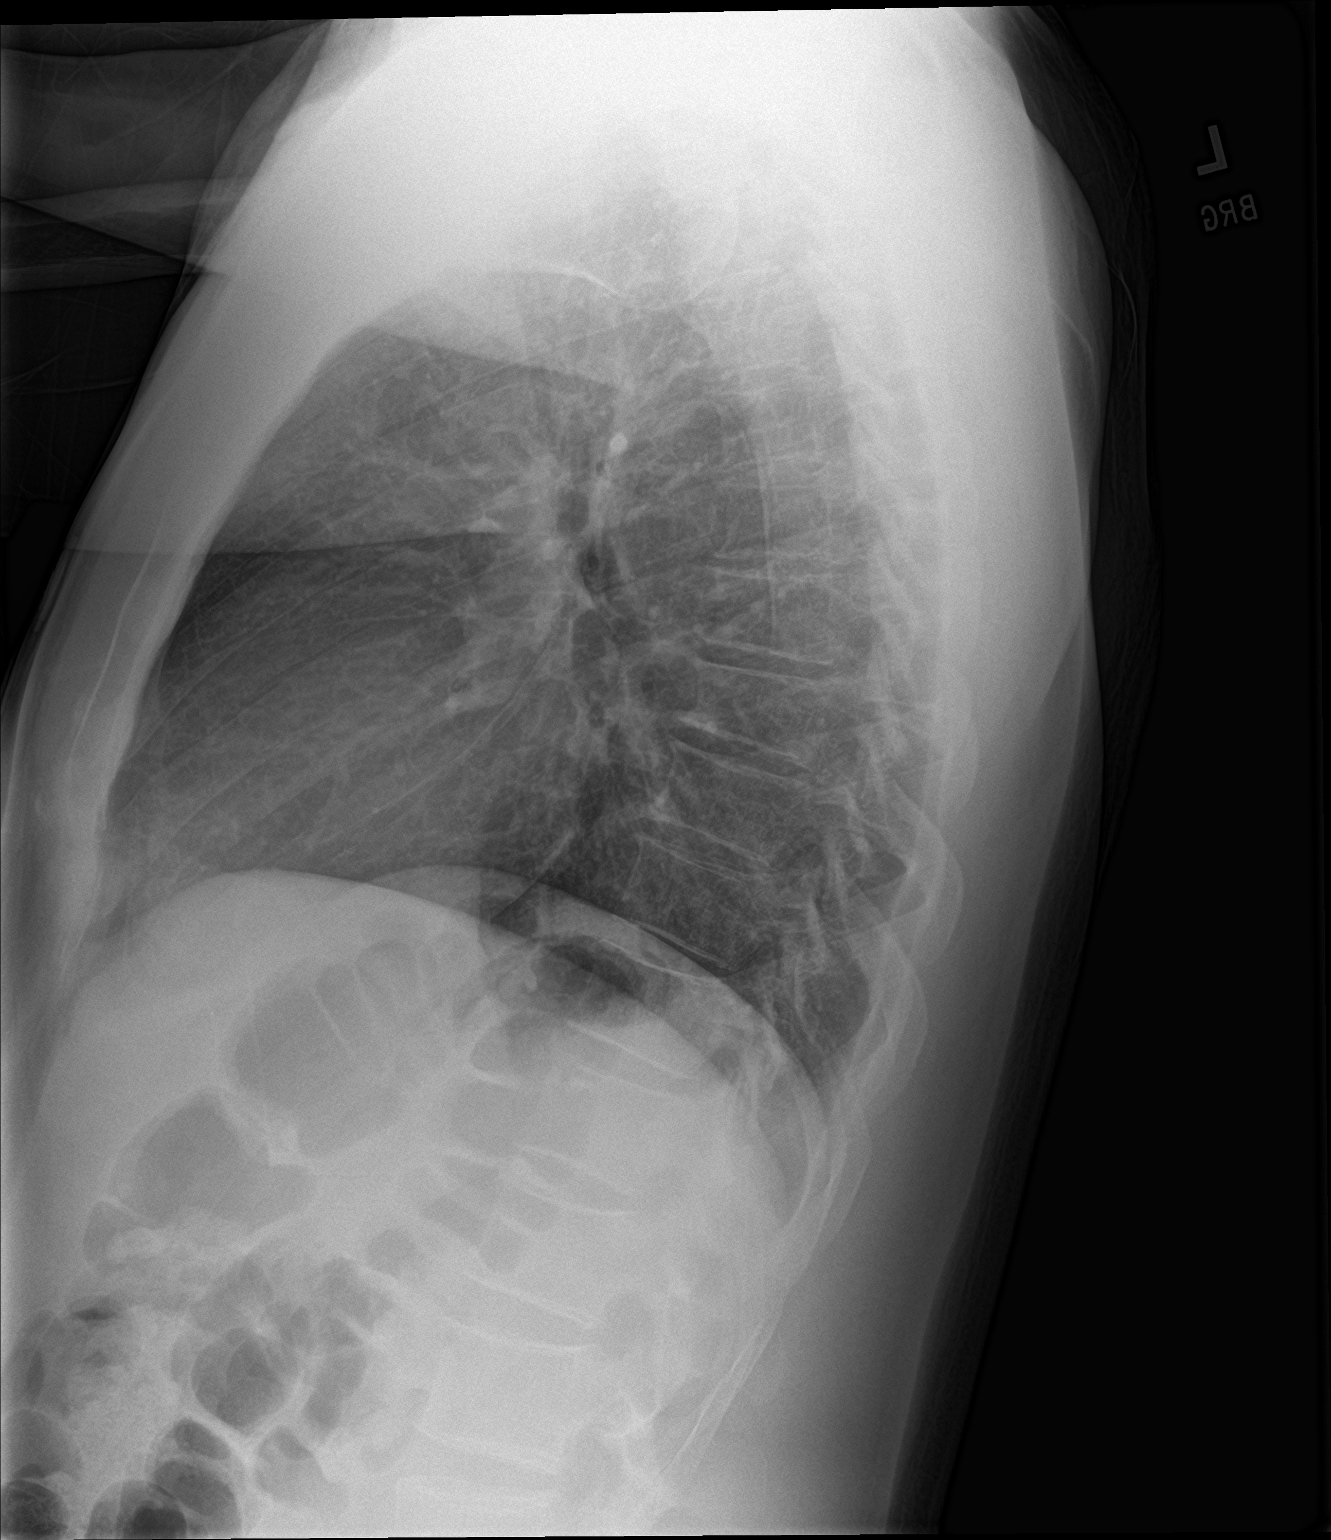

[2 of 2 positions shown; findings below may reference images not displayed]

FINDINGS: The heart size and mediastinal contours are within normal limits.
Both lungs are clear. No visible pleural effusions or pneumothorax.
No acute osseous abnormality.
IMPRESSION: No active cardiopulmonary disease.

## 2020-10-06 MED ORDER — AMOXICILLIN-POT CLAVULANATE 875-125 MG PO TABS: 1.0000 | ORAL_TABLET | Freq: Two times a day (BID) | ORAL | 0 refills | Status: AC

## 2020-10-06 MED ORDER — OMEPRAZOLE 20 MG PO CPDR: 20.0000 mg | DELAYED_RELEASE_CAPSULE | Freq: Every day | ORAL | 0 refills | Status: DC

## 2020-10-06 MED ORDER — LIDOCAINE VISCOUS HCL 2 % MT SOLN
15.0000 mL | Freq: Once | OROMUCOSAL | Status: AC
  Administered 2020-10-06: 15 mL via ORAL
  Filled 2020-10-06: qty 15

## 2020-10-06 MED ORDER — ALUM & MAG HYDROXIDE-SIMETH 200-200-20 MG/5ML PO SUSP
30.0000 mL | Freq: Once | ORAL | Status: AC
  Administered 2020-10-06: 30 mL via ORAL
  Filled 2020-10-06: qty 30

## 2020-10-06 NOTE — ED Provider Notes (Signed)
[facility] EMERGENCY DEPARTMENT Provider Note   CSN: [id number] Arrival date & time: 10/06/20  1033     History Chief Complaint  Patient presents with  . Chest Pain    Isaiah Warren is a 44 y.o. male with history of tobacco use who presents with concern for 3 days of central chest pressure and pain, that worsened today. Patient states pain came on gradually approximately 3 days ago, he does not recall what he was doing. States pain is worsened with physical activity, and is now associated with shortness of breath even at rest. He denies radiation of the pain into his shoulders or his neck. States the pain is central to left-sided. Denies palpitations. He states the pain is constant, but waxes and wanes, states that at this time pain is more just central discomfort and heaviness rather than true pain.  Denies nausea, vomiting, diarrhea, abdominal pain. Endorses mild lightheadedness, "seeing stars", but denies syncope or near syncope. He does endorse belching at home.  Patient has history of similar symptoms in the past, was seen in this emergency department in 02/2020, with benign cardiac work-up at that time. He was followed up with cardiology, who requested exercise tolerance test. Patient did not show up for that exam. 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 He states when he quit his job he lost his medical insurance, so he was not able to follow-up with cardiology, due to cost. He states he has also not been able to follow-up with primary care doctor, as he does not have insurance at this time.  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   He has strong family history for cardiac disease prior to age 30, with multiple Mis and HTN on his paternal side. He denies recent surgery, immobilization, injury. Denies history of  malignancy or cough in the past, has not on any hormone placement therapy at this time.  Additionally patient reports pain associated with broken tooth x3 days, states that chest pain began around the same time.  I personally read this patient medical records.  He has history of atypical chest pain but otherwise does not need medications every day and does not carry any medical diagnoses.  HPI  HPI: A 44 year old patient presents for evaluation of chest pain. Initial onset of pain was less than one hour ago. The patient's chest pain is described as heaviness/pressure/tightness and is worse with exertion. The patient's chest pain is middle- or left-sided, is not well-localized, is not sharp and does not radiate to the arms/jaw/neck. The patient does not complain of nausea and denies diaphoresis. The patient has smoked in the past 90 days and has a family history of coronary artery disease in a first-degree relative with onset less than age 28. The patient has no history of stroke, has no history of peripheral artery disease, denies any history of treated diabetes, is not hypertensive, has no history of hypercholesterolemia and does not have an elevated BMI (>=30).   Past Medical History:  Diagnosis Date  . History of chest pain     There are no problems to display for this patient.   Past Surgical History:  Procedure Laterality Date  . none         Family History  Problem Relation Age of  Onset  . Throat cancer Mother   . Diabetes Father   . Prostate cancer Father   . Hypertension Father     Social History   Tobacco Use  . Smoking status: Current Every Day Smoker    Packs/day: 0.50    Types: Cigarettes  . Smokeless tobacco: Never Used  . Tobacco comment: 2-3 cigarettes daily   Vaping Use  . Vaping Use: Never used  Substance Use Topics  . Alcohol use: Yes    Alcohol/week: 2.0 standard drinks    Types: 2 Cans of beer per week    Comment: "couple beers on the weekend"  .  Drug use: No    Home Medications Prior to Admission medications   Medication Sig Start Date End Date Taking? Authorizing Provider  amoxicillin-clavulanate (AUGMENTIN) 875-125 MG tablet Take 1 tablet by mouth 2 (two) times daily for 7 days. 10/06/20 10/13/20  Aviah Sorci, Gypsy Balsam, PA-C  omeprazole (PRILOSEC) 20 MG capsule Take 1 capsule (20 mg total) by mouth daily. 10/06/20 11/05/20  Kylian Loh, Gypsy Balsam, PA-C    Allergies    Patient has no known allergies.  Review of Systems   Review of Systems  Constitutional: Negative for activity change, appetite change, chills, diaphoresis, fatigue and fever.  Eyes: Negative.   Respiratory: Positive for shortness of breath. Negative for cough, chest tightness and wheezing.   Cardiovascular: Positive for chest pain. Negative for palpitations and leg swelling.  Gastrointestinal: Negative for abdominal pain, constipation, diarrhea, nausea and vomiting.  Genitourinary: Negative.   Musculoskeletal: Negative.   Skin: Negative.   Neurological: Negative.  Negative for dizziness, syncope, weakness and light-headedness.  Hematological: Negative.   Psychiatric/Behavioral: Negative.     Physical Exam Updated Vital Signs BP 124/88   Pulse 60   Temp 97.8 F (36.6 C) (Oral)   Resp 15   Ht 5' 9"  (1.753 m)   Wt 74.8 kg   SpO2 100%   BMI 24.37 kg/m   Physical Exam Vitals and nursing note reviewed.  Constitutional:      Appearance: He is normal weight. He is not ill-appearing.  HENT:     Head: Normocephalic and atraumatic.     Nose: Nose normal.     Mouth/Throat:     Mouth: Mucous membranes are moist.     Dentition: Abnormal dentition. Dental tenderness, gingival swelling and dental caries present.     Pharynx: Oropharynx is clear. Uvula midline. No oropharyngeal exudate or posterior oropharyngeal erythema.   Eyes:     General:        Right eye: No discharge.        Left eye: No discharge.     Extraocular Movements: Extraocular movements  intact.     Conjunctiva/sclera: Conjunctivae normal.     Pupils: Pupils are equal, round, and reactive to light.  Neck:     Trachea: Trachea and phonation normal.  Cardiovascular:     Rate and Rhythm: Normal rate and regular rhythm.     Pulses: Normal pulses.          Radial pulses are 2+ on the right side and 2+ on the left side.       Dorsalis pedis pulses are 2+ on the right side and 2+ on the left side.     Heart sounds: Normal heart sounds. No murmur heard.   Pulmonary:     Effort: Pulmonary effort is normal. No respiratory distress.     Breath sounds: Normal breath sounds. No wheezing or rales.  Chest:     Chest wall: No lacerations, deformity, swelling, tenderness, crepitus or edema.  Abdominal:     General: Bowel sounds are normal. There is no distension.     Palpations: Abdomen is soft.     Tenderness: There is abdominal tenderness in the epigastric area. There is no guarding or rebound.  Musculoskeletal:        General: No deformity.     Cervical back: Full passive range of motion without pain, normal range of motion and neck supple.     Right lower leg: No tenderness. No edema.     Left lower leg: No tenderness. No edema.  Lymphadenopathy:     Cervical: No cervical adenopathy.  Skin:    General: Skin is warm and dry.     Capillary Refill: Capillary refill takes less than 2 seconds.     Coloration: Skin is not ashen, cyanotic, jaundiced, mottled or pale.     Findings: No lesion or rash.  Neurological:     General: No focal deficit present.     Mental Status: He is alert and oriented to person, place, and time. Mental status is at baseline.     Cranial Nerves: Cranial nerves are intact.     Sensory: Sensation is intact.     Motor: Motor function is intact.     Coordination: Coordination is intact.  Psychiatric:        Mood and Affect: Mood normal.     ED Results / Procedures / Treatments   Labs (all labs ordered are listed, but only abnormal results are  displayed) Labs Reviewed  BASIC METABOLIC PANEL - Abnormal; Notable for the following components:      Result Value   Glucose, Bld 100 (*)    All other components within normal limits  CBC - Abnormal; Notable for the following components:   Hemoglobin 12.3 (*)    HCT 35.1 (*)    MCV 77.1 (*)    All other components within normal limits  TROPONIN I (HIGH SENSITIVITY)  TROPONIN I (HIGH SENSITIVITY)    EKG EKG Interpretation  Date/Time:  Thursday October 06 2020 11:39:36 EST Ventricular Rate:  75 PR Interval:  166 QRS Duration: 74 QT Interval:  394 QTC Calculation: 439 R Axis:   39 Text Interpretation: Normal sinus rhythm with sinus arrhythmia Normal ECG Confirmed by Milton Ferguson [phone]) on 10/06/2020 1:47:39 PM   Radiology DG Chest 2 View  Result Date: 10/06/2020 CLINICAL DATA:  Chest pain. EXAM: CHEST - 2 VIEW COMPARISON:  Mar 21, 2020. FINDINGS: The heart size and mediastinal contours are within normal limits. Both lungs are clear. No visible pleural effusions or pneumothorax. No acute osseous abnormality. IMPRESSION: No active cardiopulmonary disease. Electronically Signed   By: Margaretha Sheffield MD   On: 10/06/2020 12:05    Procedures Procedures (including critical care time)  Medications Ordered in ED Medications  alum & mag hydroxide-simeth (MAALOX/MYLANTA) 200-200-20 MG/5ML suspension 30 mL (30 mLs Oral Given 10/06/20 1413)    And  lidocaine (XYLOCAINE) 2 % viscous mouth solution 15 mL (15 mLs Oral Given 10/06/20 1413)    ED Course  I have reviewed the triage vital signs and the nursing notes.  Pertinent labs & imaging results that were available during my care of the patient were reviewed by me and considered in my medical decision making (see chart for details).    MDM Rules/Calculators/A&P HEAR Score: 2  44 year old male who presents with 3 days of central chest pressure no shortness of breath. History of atypical chest pain, strong  family history of cardiac disease.  Differential diagnosis of this patient's symptoms include but are not limited to ACS, aortic dissection, PE, pneumothorax, esophageal perforation, pericarditis, myocarditis, GERD, cholecystitis/ biliary colic, pancreatitis, herpes zoster, reactive airway disease, pleuritis, musculoskeletal pain.   Vital signs are normal on intake, patient is not tachycardic or tachypneic.  Physical exam is very reassuring. Cardiopulmonary exam is normal.  Vital signs remained stable on the monitor throughout my stay in the room.  Mild epigastric tenderness to palpation.  Incidental finding of fractured, infected right first maxillary molar.   Basic laboratory studies were performed in triage.  CBC with mild anemia 12.3,  previously 12.3.  BMP unremarkable.  Initial troponin negative, less than 2.  No indication for delta troponin.   Chest x-ray negative for acute cardiopulmonary disease.  EKG with sinus rhythm with sinus arrhythmia, no STEMI.  HEART score of 2, PERC negative.  Cardiac work-up is negative for acute abnormality, will proceed with GI cocktail with suspicion of GERD, given belching at home and epigastric tenderness to palpation on physical exam.  Patient reexamined after administration of GI cocktail; he reports notable improvement in his symptoms, though is some central chest heaviness lingering.  Vital signs remain normal on cardiac monitoring, cardiopulmonary exam remains normal.    Case discussed with attending physician.  Given reassuring vital signs, physical exam, laboratory studies, and improvement in symptoms with GI cocktail, no further work-up is warranted in the emergency department at this time.  It is likely this patient's symptoms are due in part to GERD, however still is likely an atypical chest pain component this patient symptoms.    Will provide contact information for [facility] as this patient is without medical  insurance, and was very clear in conversation with me that he would not be following up with cardiologist at this time due to cost.  Will discharge home with PPI for GERD. Additionally will discharge patient with antibiotic therapy for infected broken tooth + dental resources.  Isaiah Warren voiced understanding of his medical evaluation and treatment plan.  Each of his questions were answered to his expressed satisfaction.  Return precautions were given.  Patient is well-appearing, stable, and is appropriate for discharge at this time.   Final Clinical Impression(s) / ED Diagnoses Final diagnoses:  Atypical chest pain  Gastroesophageal reflux disease, unspecified whether esophagitis present    Rx / DC Orders ED Discharge Orders         Ordered    amoxicillin-clavulanate (AUGMENTIN) 875-125 MG tablet  2 times daily        10/06/20 1428    omeprazole (PRILOSEC) 20 MG capsule  Daily        10/06/20 1454           Mikaiya Tramble, Gypsy Balsam, PA-C 10/06/20 1509    Milton Ferguson, MD 10/07/20 1024

## 2020-10-06 NOTE — ED Triage Notes (Signed)
Pt c/o CP and sob for the last 2 days.

## 2020-10-06 NOTE — Discharge Instructions (Signed)
You were evaluated in the emergency department today for your chest pain.  Your vital signs and physical exam are very reassuring. Your blood work, chest x-ray, EKG were also very reassuring.  There is no acute problem with your heart or your lungs.  There does not appear to be an emergent cause for your symptoms, which is very good news. Your symptoms improved after administration of medications used to treat gastroesophageal reflux disease, or heartburn.  It is possible that this is contributing to the symptoms you are experiencing.  You have been prescribed a medication called Prilosec, which can help with GERD.  You should take this every day for the next 4 weeks.  Additionally attached is a list of foods to avoid in order to minimize your reflux symptoms.  Additionally, regarding your tooth pain, you have been prescribed an antibiotic, as there does appear to be infection at the site of your broken tooth.  Attached is resources for local dentistry, who may help you without insurance.  Regarding your chest pain, given your family history think it will be important to follow-up with cardiology.  Below is the contact information for [facility], which is a free clinic associated with [facility].  Please call their office to schedule a follow-up appointment in 2 to 4 weeks.  They will be able to connect you with cardiologist in a more financially accessible way.  Return to the emergency department if you develop any new or worsening chest pain, shortness of breath, palpitations, if you pass out, or if you develop any other new severe symptoms.

## 2020-12-07 ENCOUNTER — Other Ambulatory Visit: Payer: Self-pay

## 2020-12-07 ENCOUNTER — Emergency Department (HOSPITAL_COMMUNITY)
Admission: EM | Admit: 2020-12-07 | Discharge: 2020-12-07 | Disposition: A | Discharge status: Home / Self Care | Payer: Medicaid Other | Attending: Emergency Medicine | Admitting: Emergency Medicine

## 2020-12-07 ENCOUNTER — Emergency Department (HOSPITAL_COMMUNITY): Payer: Medicaid Other

## 2020-12-07 ENCOUNTER — Encounter (HOSPITAL_COMMUNITY): Payer: Self-pay

## 2020-12-07 DIAGNOSIS — R200 Anesthesia of skin: Secondary | ICD-10-CM | POA: Diagnosis not present

## 2020-12-07 DIAGNOSIS — D170 Benign lipomatous neoplasm of skin and subcutaneous tissue of head, face and neck: Secondary | ICD-10-CM | POA: Diagnosis not present

## 2020-12-07 DIAGNOSIS — F17210 Nicotine dependence, cigarettes, uncomplicated: Secondary | ICD-10-CM | POA: Diagnosis not present

## 2020-12-07 DIAGNOSIS — G629 Polyneuropathy, unspecified: Secondary | ICD-10-CM | POA: Insufficient documentation

## 2020-12-07 DIAGNOSIS — M542 Cervicalgia: Secondary | ICD-10-CM | POA: Diagnosis present

## 2020-12-07 IMAGING — MR MR CERVICAL SPINE W/O CM
5 series · 33 of 48 positions shown · non-contrast
Comparison: [DATE].

CLINICAL DATA: Paraspinal mass/tumor, transient ischemic attack
(TIA) transient left sided weakness for 2 days, evaluate for
evidence of TIA

EXAM:
MRI HEAD WITHOUT CONTRAST
MRI CERVICAL SPINE WITHOUT CONTRAST
TECHNIQUE: Multiplanar, multiecho pulse sequences of the brain and surrounding
structures, and cervical spine, to include the craniocervical
junction and cervicothoracic junction, were obtained without
intravenous contrast.

[Series 5: T2 · sagittal · 3.0mm · 0.43mm/px · 6 of 15 slices shown (1 of 2)]
[im 1/15]
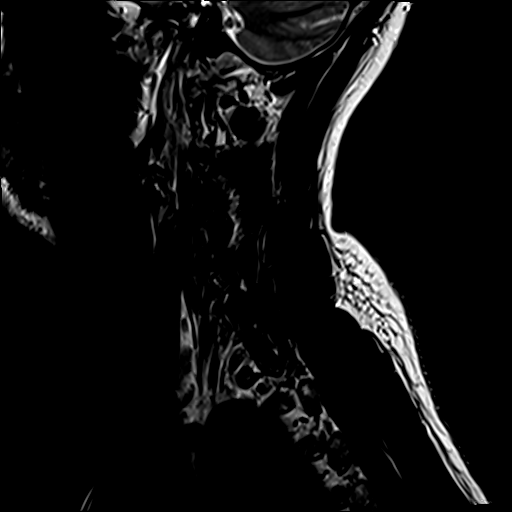
[im 3/15]
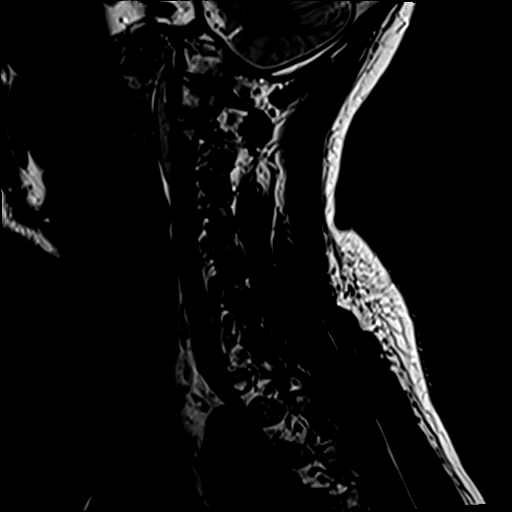
[im 6/15]
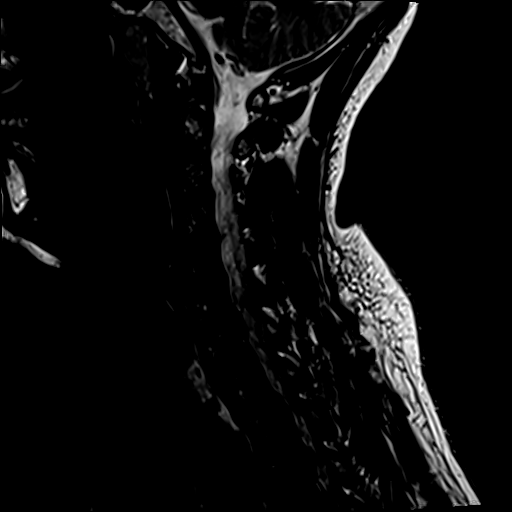
[im 9/15]
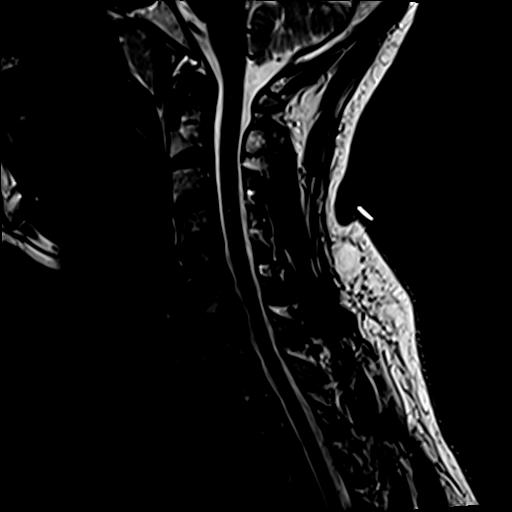
[im 12/15]
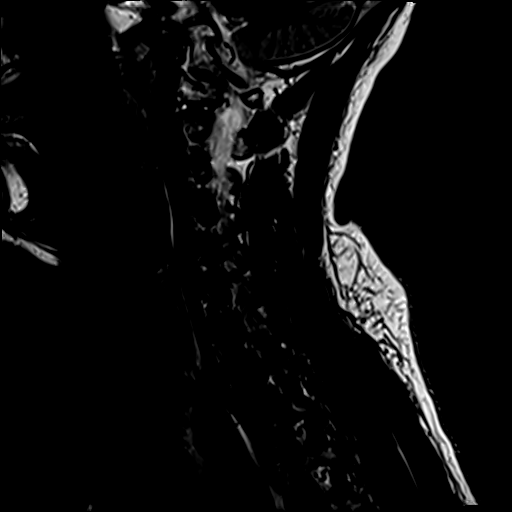
[im 15/15]
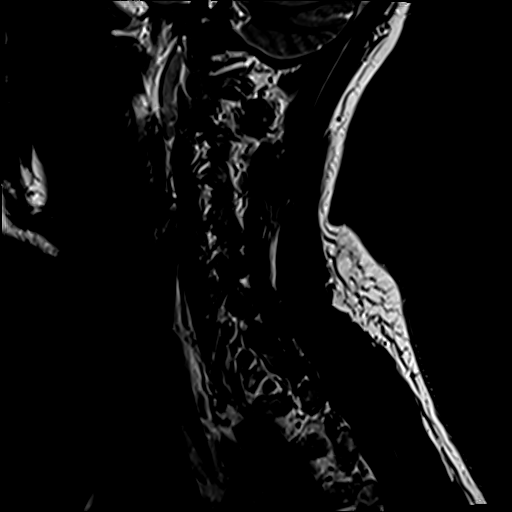

[Series 6: T1 · sagittal · 3.0mm · 0.43mm/px · 6 of 15 slices shown]
[im 1/15]
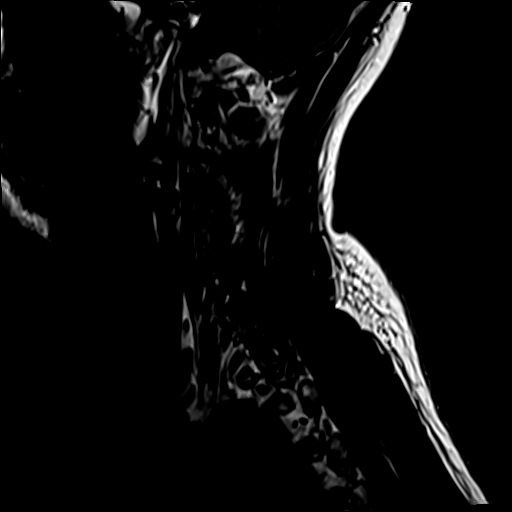
[im 3/15]
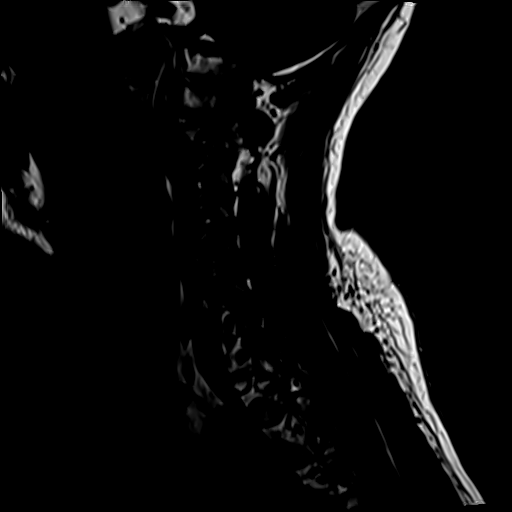
[im 6/15]
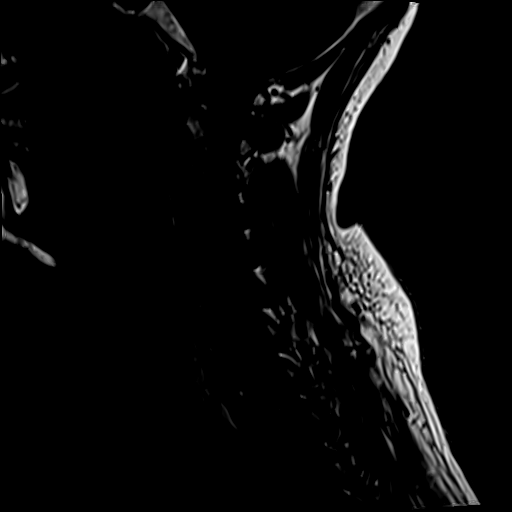
[im 9/15]
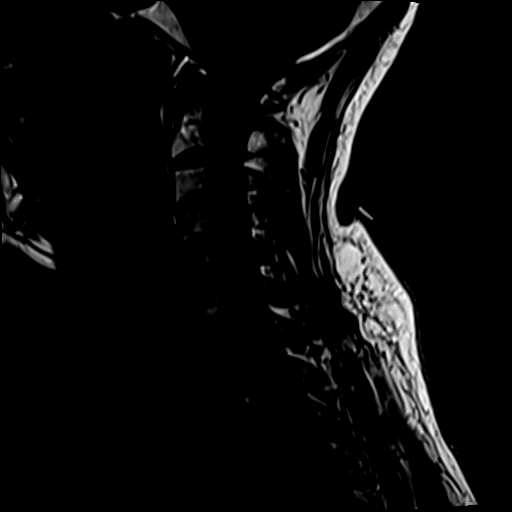
[im 12/15]
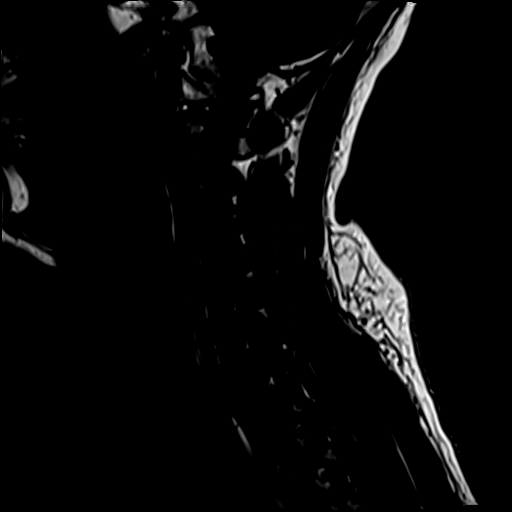
[im 15/15]
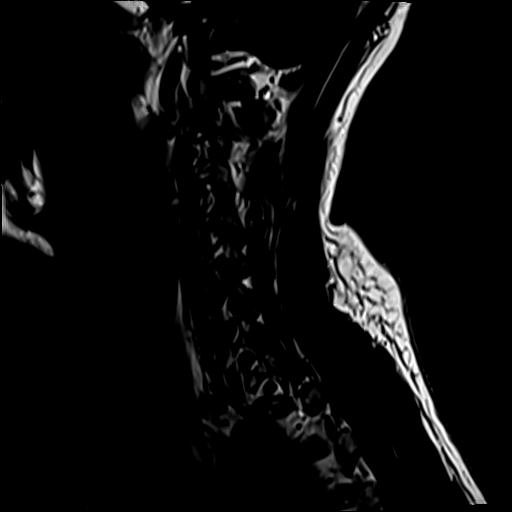

[Series 7: STIR · sagittal · 3.0mm · 0.86mm/px · 6 of 15 slices shown]
[im 1/15]
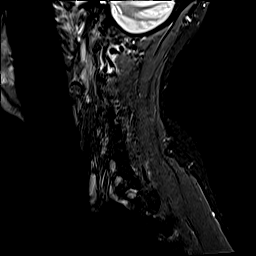
[im 3/15]
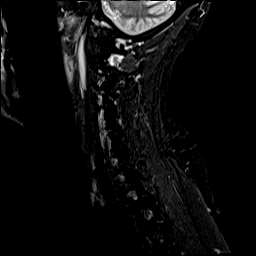
[im 6/15]
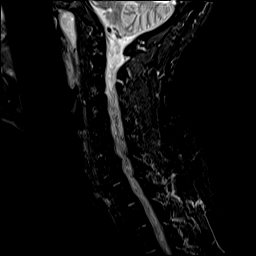
[im 9/15]
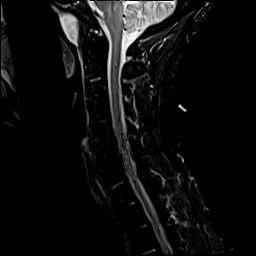
[im 12/15]
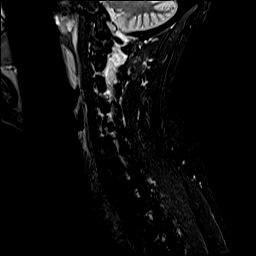
[im 15/15]
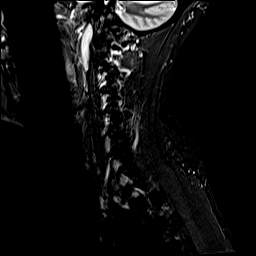

[Series 8: T2 · axial · 3.0mm · 0.78mm/px · z∈[-76,+50]mm · 9 of 40 slices shown (2 of 2)]
[im 1/40]
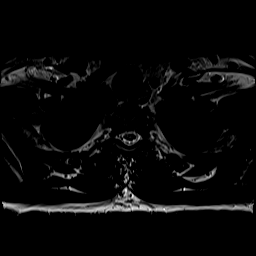
[im 6/40]
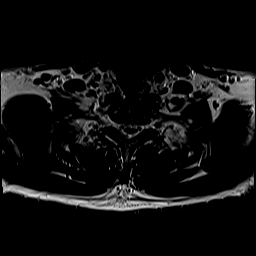
[im 12/40]
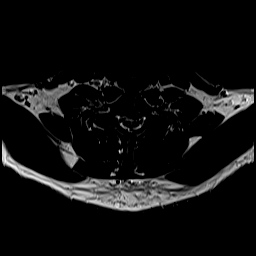
[im 17/40]
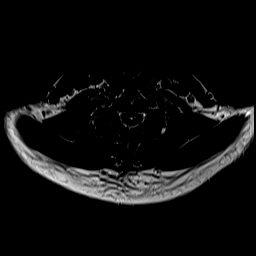
[im 20/40]
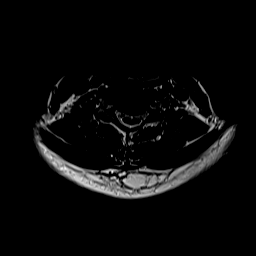
[im 23/40]
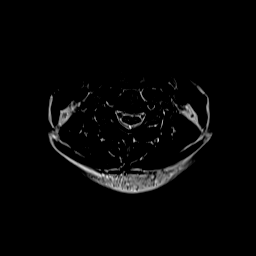
[im 28/40]
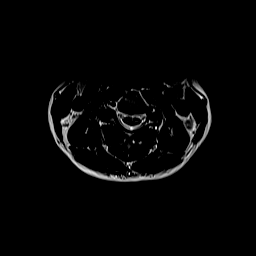
[im 34/40]
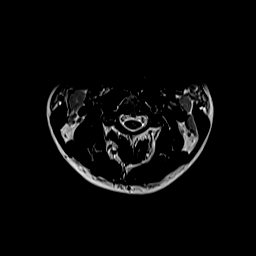
[im 40/40]
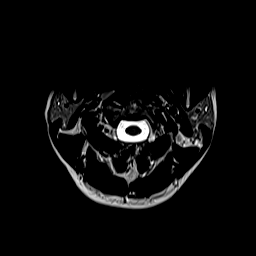

[Series 9: GRE · axial · 3.0mm · 0.78mm/px · z∈[-76,+11]mm · 6 of 40 slices shown]
[im 1/40]
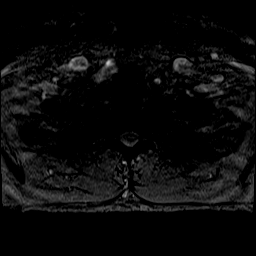
[im 6/40]
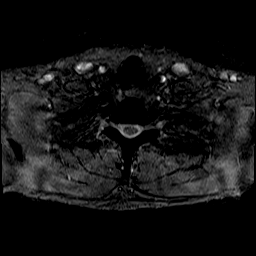
[im 12/40]
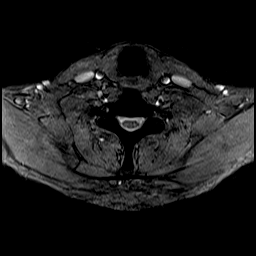
[im 17/40]
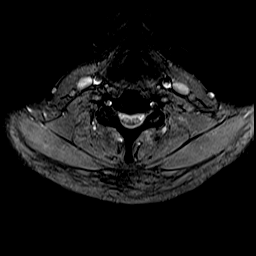
[im 23/40]
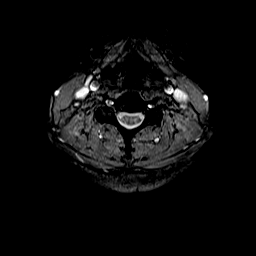
[im 28/40]
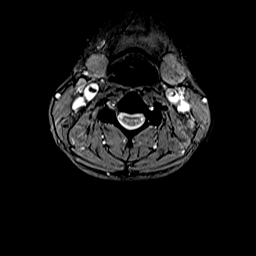

[33 of 48 positions shown; findings below may reference images not displayed]

FINDINGS: MRI HEAD FINDINGS

Brain: No diffusion-weighted signal abnormality. No intracranial
hemorrhage. No midline shift, ventriculomegaly or extra-axial fluid
collection. No mass lesion.

Vascular: Normal flow voids.

Skull and upper cervical spine: Normal marrow signal.

Sinuses/Orbits: No acute finding.  Mild pansinus mucosal thickening.

Other: None.

MRI CERVICAL SPINE FINDINGS

Alignment: Normal.

Vertebrae: Vertebral body heights are preserved. No aggressive
osseous lesion.

Cord: Normal signal and morphology.

Posterior Fossa, vertebral arteries: Negative.

Disc levels: Multilevel desiccation.

C2-3: No significant disc bulge. Patent spinal canal and neural
foramen.

C3-4: Uncovertebral and facet degenerative spurring. Patent spinal
canal and left neural foramen. Mild right neural foraminal
narrowing.

C4-5: Uncovertebral degenerative spurring. Patent spinal canal and
left neural foramen. Mild right neural foraminal narrowing.

C5-6: Small disc osteophyte complex with uncovertebral degenerative
spurring. Patent spinal canal and neural foramen.

C6-7: Disc osteophyte complex with left predominant uncovertebral
degenerative spurring. Shallow central protrusion/annular fissuring
partially effacing the ventral CSF containing spaces. Mild spinal
canal, mild right and moderate left neural foraminal narrowing.

C7-T1: Uncovertebral degenerative spurring. Patent spinal canal and
neural foramen.

Paraspinal tissues: Posterior left paracentral neck lipoma measuring
3.6 x 1.2 x 2.2 cm at the C5-6 level.
IMPRESSION: Dorsal left neck lipoma measuring 3.6 cm. No acute intracranial
process.

Mild spinal canal and moderate left neural foraminal narrowing at
the C6-7 level.

Mild right C3-5, C6-7 neural foraminal narrowing.

## 2020-12-07 IMAGING — MR MR HEAD W/O CM
9 of 10 series · 39 of 48 positions shown · non-contrast
Comparison: [DATE].

CLINICAL DATA: Paraspinal mass/tumor, transient ischemic attack
(TIA) transient left sided weakness for 2 days, evaluate for
evidence of TIA

EXAM:
MRI HEAD WITHOUT CONTRAST
MRI CERVICAL SPINE WITHOUT CONTRAST
TECHNIQUE: Multiplanar, multiecho pulse sequences of the brain and surrounding
structures, and cervical spine, to include the craniocervical
junction and cervicothoracic junction, were obtained without
intravenous contrast.

[Series 5: DWI · axial · 4.0mm · 0.88mm/px · z∈[+4,+143]mm · 5 of 36 slices shown (1 of 4)]
[im 1/36]
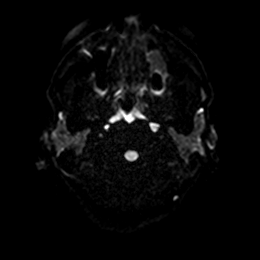
[im 9/36]
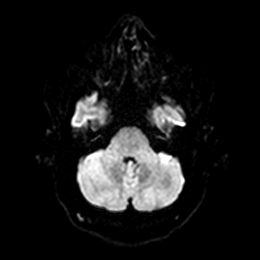
[im 18/36]
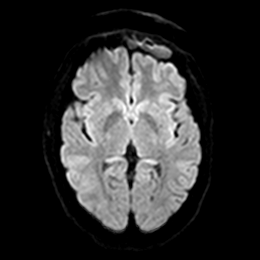
[im 27/36]
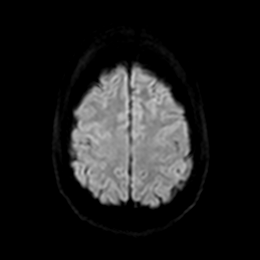
[im 36/36]
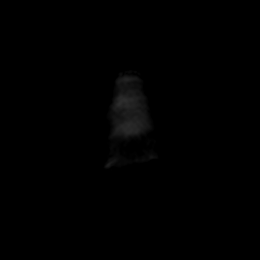

[Series 6: DWI · axial · 4.0mm · 0.88mm/px · z∈[+4,+143]mm · 5 of 36 slices shown (2 of 4)]
[im 1/36]
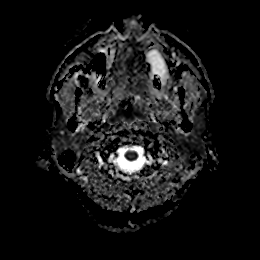
[im 9/36]
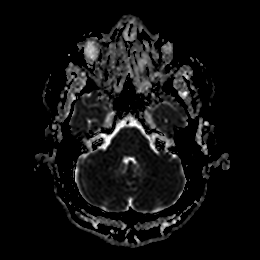
[im 18/36]
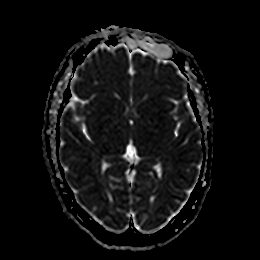
[im 27/36]
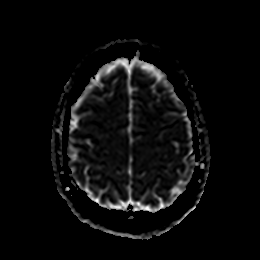
[im 36/36]
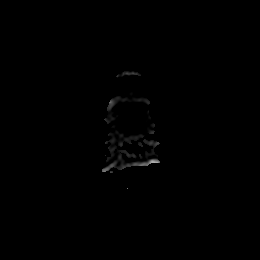

[Series 7: DWI · coronal · 4.0mm · 0.88mm/px · 5 of 32 slices shown (3 of 4)]
[im 1/32]
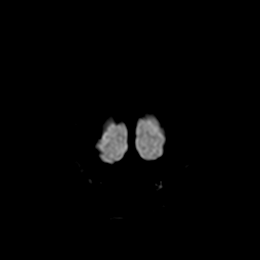
[im 8/32]
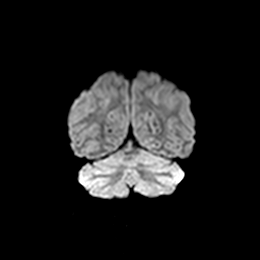
[im 16/32]
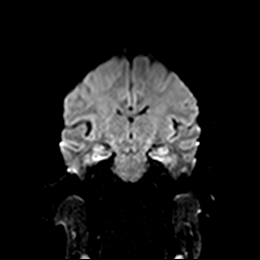
[im 24/32]
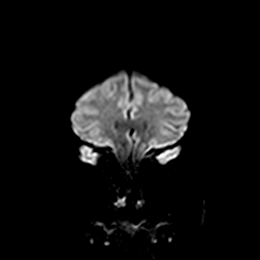
[im 32/32]
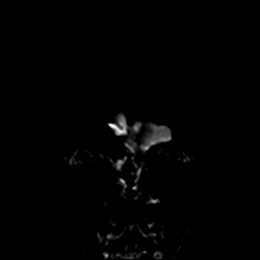

[Series 8: DWI · coronal · 4.0mm · 0.88mm/px · 5 of 32 slices shown (4 of 4)]
[im 1/32]
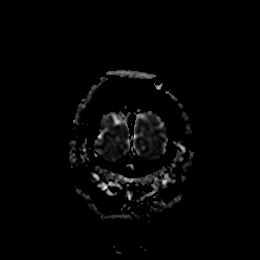
[im 8/32]
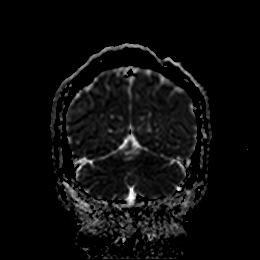
[im 16/32]
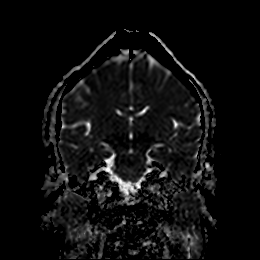
[im 24/32]
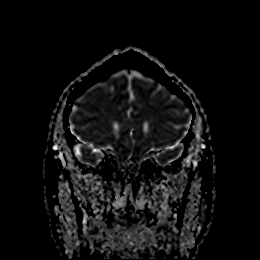
[im 32/32]
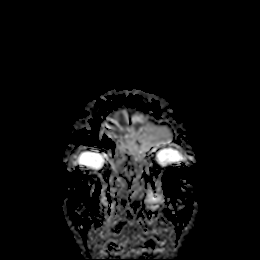

[Series 9: T1 · sagittal · 5.0mm · 0.80mm/px · 3 of 23 slices shown]
[im 1/23]
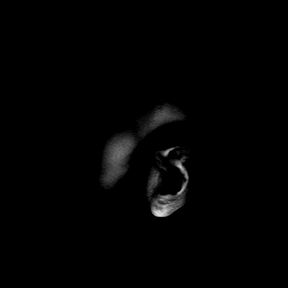
[im 12/23]
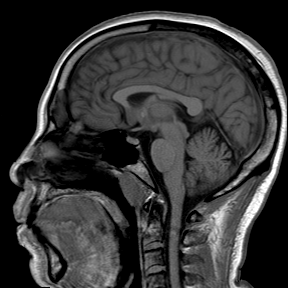
[im 23/23]
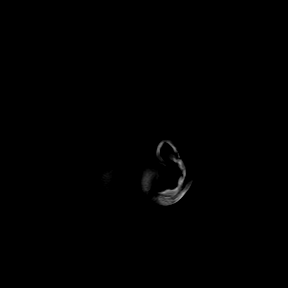

[Series 10: T2 · axial · 5.0mm · 0.72mm/px · z∈[-3,+150]mm · 3 of 23 slices shown (1 of 2)]
[im 1/23]
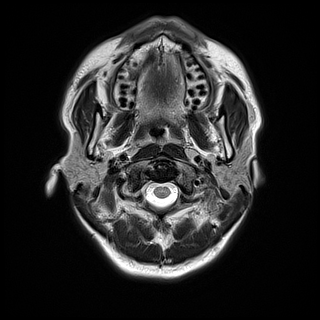
[im 12/23]
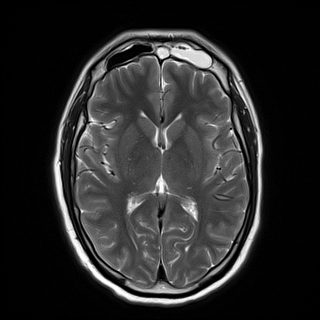
[im 23/23]
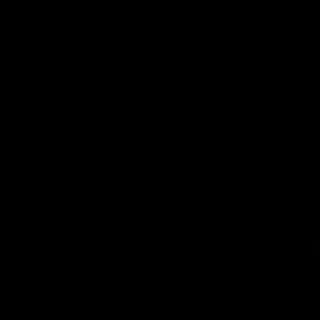

[Series 11: ax hemo · axial · 5.0mm · 0.86mm/px · z∈[+3,+146]mm · 4 of 25 slices shown]
[im 1/25]
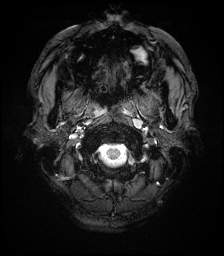
[im 9/25]
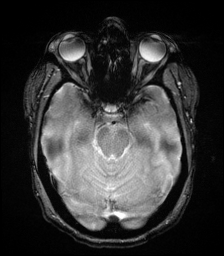
[im 17/25]
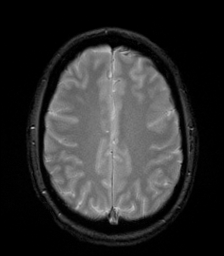
[im 25/25]
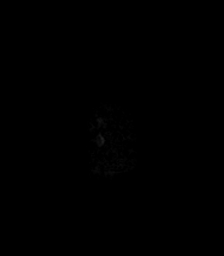

[Series 12: FLAIR · axial · 4.0mm · 0.43mm/px · z∈[+1,+148]mm · 5 of 38 slices shown]
[im 1/38]
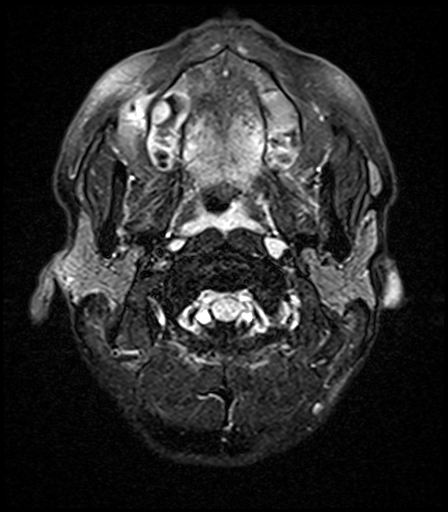
[im 10/38]
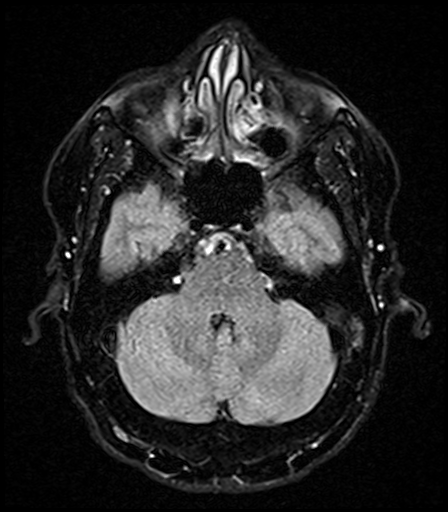
[im 19/38]
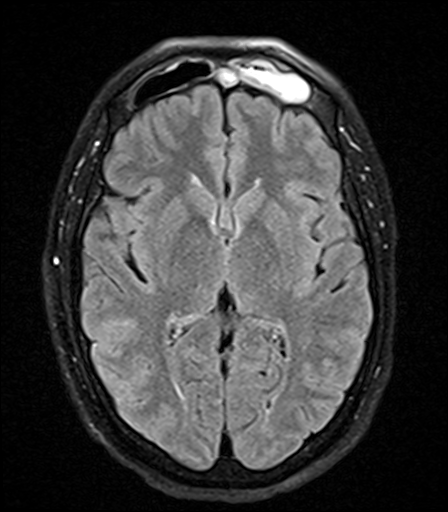
[im 28/38]
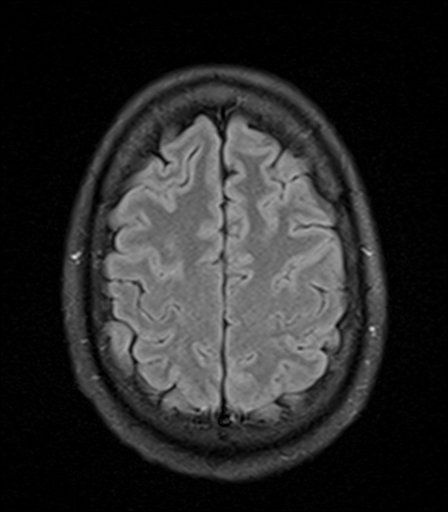
[im 38/38]
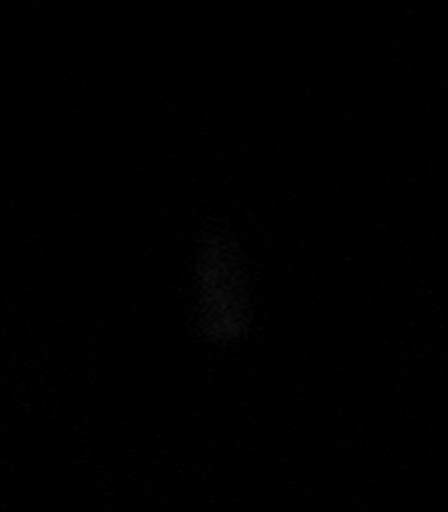

[Series 14: T2 · coronal · 5.0mm · 0.72mm/px · 4 of 28 slices shown (2 of 2)]
[im 1/28]
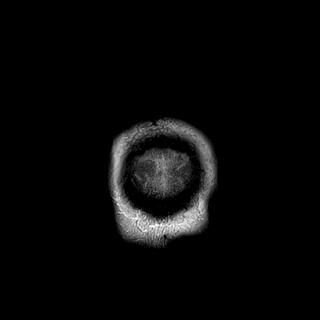
[im 10/28]
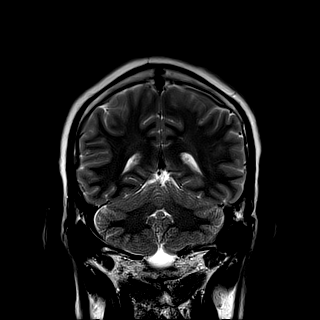
[im 19/28]
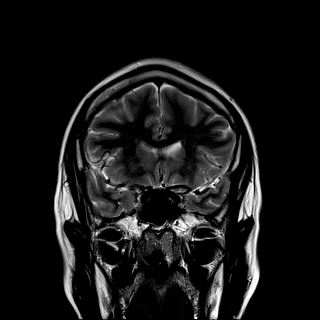
[im 28/28]
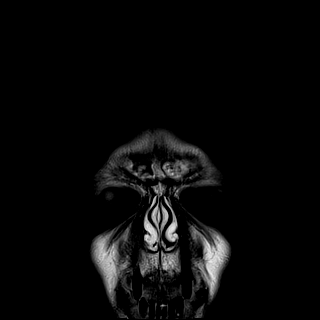

[39 of 48 positions shown; findings below may reference images not displayed]

FINDINGS: MRI HEAD FINDINGS

Brain: No diffusion-weighted signal abnormality. No intracranial
hemorrhage. No midline shift, ventriculomegaly or extra-axial fluid
collection. No mass lesion.

Vascular: Normal flow voids.

Skull and upper cervical spine: Normal marrow signal.

Sinuses/Orbits: No acute finding.  Mild pansinus mucosal thickening.

Other: None.

MRI CERVICAL SPINE FINDINGS

Alignment: Normal.

Vertebrae: Vertebral body heights are preserved. No aggressive
osseous lesion.

Cord: Normal signal and morphology.

Posterior Fossa, vertebral arteries: Negative.

Disc levels: Multilevel desiccation.

C2-3: No significant disc bulge. Patent spinal canal and neural
foramen.

C3-4: Uncovertebral and facet degenerative spurring. Patent spinal
canal and left neural foramen. Mild right neural foraminal
narrowing.

C4-5: Uncovertebral degenerative spurring. Patent spinal canal and
left neural foramen. Mild right neural foraminal narrowing.

C5-6: Small disc osteophyte complex with uncovertebral degenerative
spurring. Patent spinal canal and neural foramen.

C6-7: Disc osteophyte complex with left predominant uncovertebral
degenerative spurring. Shallow central protrusion/annular fissuring
partially effacing the ventral CSF containing spaces. Mild spinal
canal, mild right and moderate left neural foraminal narrowing.

C7-T1: Uncovertebral degenerative spurring. Patent spinal canal and
neural foramen.

Paraspinal tissues: Posterior left paracentral neck lipoma measuring
3.6 x 1.2 x 2.2 cm at the C5-6 level.
IMPRESSION: Dorsal left neck lipoma measuring 3.6 cm. No acute intracranial
process.

Mild spinal canal and moderate left neural foraminal narrowing at
the C6-7 level.

Mild right C3-5, C6-7 neural foraminal narrowing.

## 2020-12-07 NOTE — ED Notes (Signed)
Patient transported to MRI 

## 2020-12-07 NOTE — ED Provider Notes (Signed)
[facility] EMERGENCY DEPARTMENT Provider Note   CSN: [id number] Arrival date & time: 12/07/20  1017     History Chief Complaint  Patient presents with  . Neck Pain    Isaiah Warren is a 45 y.o. male with no significant past medical history presenting with a 2-day history of intermittent left arm and leg numbness with an associated nodule that has developed at his left lower posterior cervical spine.  He reports noticing a "knot", nontender at his left cervical spine 2 days ago, it was gone yesterday and now returned again today.  He also describes numbness to his left fingertips and left ankle region intermittently, occurring mostly when in a seated position, and lasting 5 to 30 minutes, improves with movement, multiple episodes x 2 days, currently sx free.  He denies pain in his neck, denies injuries.  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  He has had no fevers or chills, denies shortness of breath, headache, neck stiffness or pain.  He denies IV drug use.  He has had no medications or treatment for his symptoms.  As mentioned, movement and flexing his extremities improve these numb episodes.  The history is provided by the patient.       Past Medical History:  Diagnosis Date  . History of chest pain     There are no problems to display for this patient.   Past Surgical History:  Procedure Laterality Date  . none         Family History  Problem Relation Age of Onset  . Throat cancer Mother   . Diabetes Father   . Prostate cancer Father   . Hypertension Father     Social History   Tobacco Use  . Smoking status: Current Every Day Smoker    Packs/day: 0.50    Types: Cigarettes  . Smokeless tobacco: Never Used  . Tobacco comment: 2-3 cigarettes daily   Vaping Use  . Vaping Use: Never used  Substance Use Topics  . Alcohol use: Yes    Alcohol/week: 2.0 standard drinks     Types: 2 Cans of beer per week    Comment: "couple beers on the weekend"  . Drug use: No    Home Medications Prior to Admission medications   Medication Sig Start Date End Date Taking? Authorizing Provider  omeprazole (PRILOSEC) 20 MG capsule Take 1 capsule (20 mg total) by mouth daily. 10/06/20 11/05/20  Sponseller, Gypsy Balsam, PA-C    Allergies    Patient has no known allergies.  Review of Systems   Review of Systems  Constitutional: Negative for chills and fever.  HENT: Negative for congestion and sore throat.   Eyes: Negative.   Respiratory: Negative for chest tightness and shortness of breath.   Cardiovascular: Negative for chest pain, palpitations and leg swelling.  Gastrointestinal: Negative for abdominal pain, nausea and vomiting.  Genitourinary: Negative.   Musculoskeletal: Negative for arthralgias, joint swelling, neck pain and neck stiffness.  Skin: Negative.  Negative for color change, rash and wound.  Neurological: Positive for numbness. Negative for dizziness, weakness, light-headedness and headaches.  Psychiatric/Behavioral: Negative.   All other systems reviewed and are negative.   Physical Exam Updated Vital Signs BP 113/88   Pulse 86   Temp 98.3 F (36.8 C) (Oral)   Resp 20   Ht 5' 9"  (1.753 m)   Wt 74.8 kg   SpO2  99%   BMI 24.37 kg/m   Physical Exam Vitals and nursing note reviewed.  Constitutional:      Appearance: He is well-developed and well-nourished.  HENT:     Head: Normocephalic and atraumatic.  Eyes:     Conjunctiva/sclera: Conjunctivae normal.     Pupils: Pupils are equal, round, and reactive to light.     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  Cardiovascular:     Rate and Rhythm: Normal rate and regular rhythm.     Pulses: Intact distal pulses.     Heart sounds: Normal heart sounds.  Pulmonary:     Effort: Pulmonary effort is normal.     Breath sounds: Normal breath sounds. No wheezing.   Musculoskeletal:        General: Normal range of motion.     Cervical back: Normal range of motion.  Skin:    General: Skin is warm and dry.     Comments: Soft mobile subcutaneous mass left cervical spine at the C7-T1 level.    Neurological:     Mental Status: He is alert.     Cranial Nerves: Cranial nerves are intact.     Motor: Motor function is intact.     Coordination: Finger-Nose-Finger Test and Heel to [place] Test normal. Rapid alternating movements normal.     Deep Tendon Reflexes:     Reflex Scores:      Bicep reflexes are 2+ on the right side and 2+ on the left side.      Patellar reflexes are 2+ on the right side and 2+ on the left side.    Comments: Equal grip strength.  Psychiatric:        Mood and Affect: Mood and affect normal.     ED Results / Procedures / Treatments   Labs (all labs ordered are listed, but only abnormal results are displayed) Labs Reviewed - No data to display  EKG None  Radiology MR BRAIN WO CONTRAST  Result Date: 12/07/2020 CLINICAL DATA:  Paraspinal mass/tumor, transient ischemic attack (TIA) transient left sided weakness for 2 days, evaluate for evidence of TIA EXAM: MRI HEAD WITHOUT CONTRAST MRI CERVICAL SPINE WITHOUT CONTRAST TECHNIQUE: Multiplanar, multiecho pulse sequences of the brain and surrounding structures, and cervical spine, to include the craniocervical junction and cervicothoracic junction, were obtained without intravenous contrast. COMPARISON:  12/14/2001. FINDINGS: MRI HEAD FINDINGS Brain: No diffusion-weighted signal abnormality. No intracranial hemorrhage. No midline shift, ventriculomegaly or extra-axial fluid collection. No mass lesion. Vascular: Normal flow voids. Skull and upper cervical spine: Normal marrow signal. Sinuses/Orbits: No acute finding.  Mild pansinus mucosal thickening. Other: None. MRI CERVICAL SPINE FINDINGS Alignment: Normal. Vertebrae: Vertebral body heights are preserved. No aggressive osseous lesion. Cord:  Normal signal and morphology. Posterior Fossa, vertebral arteries: Negative. Disc levels: Multilevel desiccation. C2-3: No significant disc bulge. Patent spinal canal and neural foramen. C3-4: Uncovertebral and facet degenerative spurring. Patent spinal canal and left neural foramen. Mild right neural foraminal narrowing. C4-5: Uncovertebral degenerative spurring. Patent spinal canal and left neural foramen. Mild right neural foraminal narrowing. C5-6: Small disc osteophyte complex with uncovertebral degenerative spurring. Patent spinal canal and neural foramen. C6-7: Disc osteophyte complex with left predominant uncovertebral degenerative spurring. Shallow central protrusion/annular fissuring partially effacing the ventral CSF containing spaces. Mild spinal canal, mild right and moderate left neural foraminal narrowing. C7-T1: Uncovertebral degenerative spurring. Patent spinal canal and neural foramen. Paraspinal tissues: Posterior left paracentral neck lipoma measuring 3.6 x 1.2 x  2.2 cm at the C5-6 level. 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 No acute intracranial process. Mild spinal canal and moderate left neural foraminal narrowing at the C6-7 level. Mild right C3-5, C6-7 neural foraminal narrowing. Electronically Signed   By: Primitivo Gauze M.D.   On: 12/07/2020 13:02   MR Cervical Spine Wo Contrast  Result Date: 12/07/2020 CLINICAL DATA:  Paraspinal mass/tumor, transient ischemic attack (TIA) transient left sided weakness for 2 days, evaluate for evidence of TIA EXAM: MRI HEAD WITHOUT CONTRAST MRI CERVICAL SPINE WITHOUT CONTRAST TECHNIQUE: Multiplanar, multiecho pulse sequences of the brain and surrounding structures, and cervical spine, to include the craniocervical junction and cervicothoracic junction, were obtained without intravenous contrast. COMPARISON:  12/14/2001. FINDINGS: MRI HEAD FINDINGS Brain: No diffusion-weighted signal abnormality. No intracranial hemorrhage. No  midline shift, ventriculomegaly or extra-axial fluid collection. No mass lesion. Vascular: Normal flow voids. Skull and upper cervical spine: Normal marrow signal. Sinuses/Orbits: No acute finding.  Mild pansinus mucosal thickening. Other: None. MRI CERVICAL SPINE FINDINGS Alignment: Normal. Vertebrae: Vertebral body heights are preserved. No aggressive osseous lesion. Cord: Normal signal and morphology. Posterior Fossa, vertebral arteries: Negative. Disc levels: Multilevel desiccation. C2-3: No significant disc bulge. Patent spinal canal and neural foramen. C3-4: Uncovertebral and facet degenerative spurring. Patent spinal canal and left neural foramen. Mild right neural foraminal narrowing. C4-5: Uncovertebral degenerative spurring. Patent spinal canal and left neural foramen. Mild right neural foraminal narrowing. C5-6: Small disc osteophyte complex with uncovertebral degenerative spurring. Patent spinal canal and neural foramen. C6-7: Disc osteophyte complex with left predominant uncovertebral degenerative spurring. Shallow central protrusion/annular fissuring partially effacing the ventral CSF containing spaces. Mild spinal canal, mild right and moderate left neural foraminal narrowing. C7-T1: Uncovertebral degenerative spurring. Patent spinal canal and neural foramen. 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 No acute intracranial process. Mild spinal canal and moderate left neural foraminal narrowing at the C6-7 level. Mild right C3-5, C6-7 neural foraminal narrowing. Electronically Signed   By: Primitivo Gauze M.D.   On: 12/07/2020 13:02    Procedures Procedures   Medications Ordered in ED Medications - No data to display  ED Course  I have reviewed the triage vital signs and the nursing notes.  Pertinent labs & imaging results that were available during my care of the  patient were reviewed by me and considered in my medical decision making (see chart for details).    MDM Rules/Calculators/A&P                          MRI of c spine and brain obtained with no evidence of acute cva, neck lipoma as suspected by exam without any interference with nerve roots or cord. Mild to moderate foraminal narrowing but no nerve or cord involvement.  Pt has no weakness, normal gait, sensory deficits which are intermittent and focal.  Discussed with Dr. Curly Shores with neurology. Advised f/u with Dr Merlene Laughter, referral given for outpt f/u.   Final Clinical Impression(s) / ED Diagnoses Final diagnoses:  Neuropathy  Lipoma of neck    Rx / DC Orders ED Discharge Orders    None       Landis Martins 12/08/20 7510    Wyvonnia Dusky, MD 12/08/20 1705

## 2020-12-07 NOTE — Discharge Instructions (Addendum)
Your MRI is negative for acute neck, cervical, spinal column or brain abnormalities that would explain your numbness in your left arm and leg (no stroke, no spinal cord injury or compression).  The nodule you feel is a simple lipoma which does not require any intervention and is not associated with your numbness symptom.     You will benefit by seeing a neurologist for further evaluation of the numbness - call Dr Merlene Laughter for an office visit for this evaluation.

## 2020-12-07 NOTE — ED Triage Notes (Signed)
Pt presents to ED with complaints of neck pain and knot on the back of his neck started 2 days ago. Pt c/o numbness down left arm and left leg with neck pain.

## 2021-03-07 ENCOUNTER — Other Ambulatory Visit: Payer: Self-pay

## 2021-03-07 ENCOUNTER — Encounter: Payer: Self-pay | Admitting: Nurse Practitioner

## 2021-03-07 ENCOUNTER — Ambulatory Visit (INDEPENDENT_AMBULATORY_CARE_PROVIDER_SITE_OTHER): Payer: Medicaid Other | Admitting: Nurse Practitioner

## 2021-03-07 VITALS — BP 133/84 | HR 108 | Temp 98.8°F | Resp 20 | Ht 69.0 in | Wt 192.0 lb

## 2021-03-07 DIAGNOSIS — Z1159 Encounter for screening for other viral diseases: Secondary | ICD-10-CM | POA: Diagnosis not present

## 2021-03-07 DIAGNOSIS — Z7689 Persons encountering health services in other specified circumstances: Secondary | ICD-10-CM

## 2021-03-07 DIAGNOSIS — Z139 Encounter for screening, unspecified: Secondary | ICD-10-CM | POA: Insufficient documentation

## 2021-03-07 DIAGNOSIS — R0789 Other chest pain: Secondary | ICD-10-CM | POA: Diagnosis not present

## 2021-03-07 DIAGNOSIS — Z0001 Encounter for general adult medical examination with abnormal findings: Secondary | ICD-10-CM | POA: Insufficient documentation

## 2021-03-07 NOTE — Assessment & Plan Note (Signed)
-  no PCP since [employer] discharge, so no records to obtain

## 2021-03-07 NOTE — Assessment & Plan Note (Signed)
-  will screen for HCV and HIV with next set of labs

## 2021-03-07 NOTE — Progress Notes (Signed)
New Patient Office Visit  Subjective:  Patient ID: Isaiah Warren, male    DOB: 1976/07/23  Age: 45 y.o. MRN: [id number]  CC:  Chief Complaint  Patient presents with  . New Patient (Initial Visit)    HPI Isaiah Warren presents for new patient visit. No recent PCP, physical, or labs.  He has a neck lipoma, and had imaging completed on 12/07/20.  He had chest pain in December 2021. He saw cardiology, and not sure what the etiology was. No hx of anxiety or PTSD.  He states he hasn't had the chest pain in about a month.  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 No acute issues today.   Past Medical History:  Diagnosis Date  . History of chest pain     Past Surgical History:  Procedure Laterality Date  . none      Family History  Problem Relation Age of Onset  . Throat cancer Mother   . Diabetes Father   . Prostate cancer Father   . Hypertension Father     Social History   Socioeconomic History  . Marital status: Single    Spouse name: Not on file  . Number of children: 3  . Years of education: Not on file  . Highest education level: Not on file  Occupational History  . Occupation: Unemployed  Tobacco Use  . Smoking status: Current Every Day Smoker    Packs/day: 0.50    Years: 26.00    Pack years: 13.00    Types: Cigarettes  . Smokeless tobacco: Never Used  . Tobacco comment: 2-3 cigarettes daily   Vaping Use  . Vaping Use: Never used  Substance and Sexual Activity  . Alcohol use: Yes    Alcohol/week: 2.0 standard drinks    Types: 2 Cans of beer per week    Comment: "couple beers on the weekend"  . Drug use: No  . Sexual activity: Not Currently  Other Topics Concern  . Not on file  Social History Narrative   2 children at home and 1 is a Multimedia programmer Strain: Not on file  Food Insecurity: Not on file  Transportation Needs: Not on file  Physical Activity: Not on  file  Stress: Not on file  Social Connections: Not on file  Intimate Partner Violence: Not on file    ROS Review of Systems  Constitutional: Negative.   Respiratory: Negative.   Cardiovascular: Positive for chest pain. Negative for palpitations and leg swelling.       Last episode a month ago  Musculoskeletal: Negative.   Psychiatric/Behavioral: Negative.     Objective:   Today's Vitals: BP 133/84   Pulse (!) 108   Temp 98.8 F (37.1 C)   Resp 20   Ht 5' 9"  (1.753 m)   Wt 192 lb (87.1 kg)   SpO2 96%   BMI 28.35 kg/m   Physical Exam Constitutional:      Appearance: Normal appearance.  Cardiovascular:     Rate and Rhythm: Regular rhythm. Tachycardia present.     Pulses: Normal pulses.     Heart sounds: Normal heart sounds.  Pulmonary:     Effort: Pulmonary effort is normal.     Breath sounds: Normal breath sounds.  Neurological:     Mental Status: He is alert.  Psychiatric:        Mood and Affect: Mood normal.  Behavior: Behavior normal.        Thought Content: Thought content normal.        Judgment: Judgment normal.     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 [DISCONTINUED] omeprazole (PRILOSEC) 20 MG capsule Take 1 capsule (20 mg total) by mouth daily.   No facility-administered encounter medications on file as of 03/07/2021.    Follow-up: Return in about 1 week (around 03/14/2021) for Physical Exam with EKG.   Noreene Larsson, NP

## 2021-03-07 NOTE — Assessment & Plan Note (Signed)
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

## 2021-03-07 NOTE — Patient Instructions (Addendum)
Let's get fasting labs today.

## 2021-03-08 ENCOUNTER — Other Ambulatory Visit: Payer: Self-pay | Admitting: Nurse Practitioner

## 2021-03-08 DIAGNOSIS — E7800 Pure hypercholesterolemia, unspecified: Secondary | ICD-10-CM

## 2021-03-08 LAB — CBC WITH DIFFERENTIAL/PLATELET
Basophils Absolute: 0 10*3/uL (ref 0.0–0.2)
Basos: 0 %
EOS (ABSOLUTE): 0.3 10*3/uL (ref 0.0–0.4)
Eos: 4 %
Hematocrit: 39 % (ref 37.5–51.0)
Hemoglobin: 13.1 g/dL (ref 13.0–17.7)
Immature Grans (Abs): 0 10*3/uL (ref 0.0–0.1)
Immature Granulocytes: 0 %
Lymphocytes Absolute: 2 10*3/uL (ref 0.7–3.1)
Lymphs: 28 %
MCH: 26.6 pg (ref 26.6–33.0)
MCHC: 33.6 g/dL (ref 31.5–35.7)
MCV: 79 fL (ref 79–97)
Monocytes Absolute: 0.7 10*3/uL (ref 0.1–0.9)
Monocytes: 9 %
Neutrophils Absolute: 4.3 10*3/uL (ref 1.4–7.0)
Neutrophils: 59 %
Platelets: 322 10*3/uL (ref 150–450)
RBC: 4.92 x10E6/uL (ref 4.14–5.80)
RDW: 15.8 % — ABNORMAL HIGH (ref 11.6–15.4)
WBC: 7.3 10*3/uL (ref 3.4–10.8)

## 2021-03-08 LAB — CMP14+EGFR
ALT: 18 IU/L (ref 0–44)
AST: 14 IU/L (ref 0–40)
Albumin/Globulin Ratio: 1.6 (ref 1.2–2.2)
Albumin: 4.6 g/dL (ref 4.0–5.0)
Alkaline Phosphatase: 124 IU/L — ABNORMAL HIGH (ref 44–121)
BUN/Creatinine Ratio: 8 — ABNORMAL LOW (ref 9–20)
BUN: 8 mg/dL (ref 6–24)
Bilirubin Total: 0.4 mg/dL (ref 0.0–1.2)
CO2: 19 mmol/L — ABNORMAL LOW (ref 20–29)
Calcium: 9.7 mg/dL (ref 8.7–10.2)
Chloride: 106 mmol/L (ref 96–106)
Creatinine, Ser: 0.95 mg/dL (ref 0.76–1.27)
Globulin, Total: 2.9 g/dL (ref 1.5–4.5)
Glucose: 94 mg/dL (ref 65–99)
Potassium: 4.2 mmol/L (ref 3.5–5.2)
Sodium: 143 mmol/L (ref 134–144)
Total Protein: 7.5 g/dL (ref 6.0–8.5)
eGFR: 101 mL/min/{1.73_m2} (ref >59)

## 2021-03-08 LAB — LIPID PANEL WITH LDL/HDL RATIO
Cholesterol, Total: 282 mg/dL — ABNORMAL HIGH (ref 100–199)
HDL: 49 mg/dL (ref >39)
LDL Chol Calc (NIH): 216 mg/dL — ABNORMAL HIGH (ref 0–99)
LDL/HDL Ratio: 4.4 ratio — ABNORMAL HIGH (ref 0.0–3.6)
Triglycerides: 98 mg/dL (ref 0–149)
VLDL Cholesterol Cal: 17 mg/dL (ref 5–40)

## 2021-03-08 LAB — HIV ANTIBODY (ROUTINE TESTING W REFLEX): HIV Screen 4th Generation wRfx: NONREACTIVE

## 2021-03-08 LAB — HEPATITIS C ANTIBODY: Hep C Virus Ab: <0.1 s/co ratio (ref 0.0–0.9)

## 2021-03-08 MED ORDER — ATORVASTATIN CALCIUM 40 MG PO TABS: 40.0000 mg | ORAL_TABLET | Freq: Every day | ORAL | 3 refills | Status: DC

## 2021-03-08 NOTE — Progress Notes (Signed)
Hemoglobin has improved since last set of labs.  Cholesterol is significantly elevated, so I will start a medication for that. Take atorvastatin in the evening to reduce the risk or side effects, the most common being muscle aches.

## 2021-03-08 NOTE — Progress Notes (Signed)
Lab Results  Component Value Date   CHOL 282 (H) 03/07/2021   HDL 49 03/07/2021   LDLCALC 216 (H) 03/07/2021   TRIG 98 03/07/2021   -Rx. Atorvastatin for high LDL

## 2021-03-15 ENCOUNTER — Encounter: Payer: Medicaid Other | Admitting: Nurse Practitioner

## 2021-03-16 ENCOUNTER — Encounter: Payer: Medicaid Other | Admitting: Nurse Practitioner

## 2021-04-05 ENCOUNTER — Other Ambulatory Visit: Payer: Self-pay

## 2021-04-05 ENCOUNTER — Encounter: Payer: Self-pay | Admitting: Nurse Practitioner

## 2021-04-05 ENCOUNTER — Ambulatory Visit (INDEPENDENT_AMBULATORY_CARE_PROVIDER_SITE_OTHER): Payer: Medicaid Other | Admitting: Nurse Practitioner

## 2021-04-05 VITALS — BP 122/81 | HR 88 | Temp 97.6°F | Resp 20 | Ht 69.0 in | Wt 194.0 lb

## 2021-04-05 DIAGNOSIS — E7800 Pure hypercholesterolemia, unspecified: Secondary | ICD-10-CM

## 2021-04-05 DIAGNOSIS — Z1211 Encounter for screening for malignant neoplasm of colon: Secondary | ICD-10-CM

## 2021-04-05 DIAGNOSIS — R0789 Other chest pain: Secondary | ICD-10-CM

## 2021-04-05 DIAGNOSIS — K30 Functional dyspepsia: Secondary | ICD-10-CM

## 2021-04-05 DIAGNOSIS — E785 Hyperlipidemia, unspecified: Secondary | ICD-10-CM | POA: Insufficient documentation

## 2021-04-05 DIAGNOSIS — K219 Gastro-esophageal reflux disease without esophagitis: Secondary | ICD-10-CM | POA: Insufficient documentation

## 2021-04-05 MED ORDER — ALUM & MAG HYDROXIDE-SIMETH 200-200-20 MG/5ML PO SUSP: 15.0000 mL | Freq: Four times a day (QID) | ORAL | 0 refills | Status: AC | PRN

## 2021-04-05 NOTE — Patient Instructions (Signed)
Please have fasting labs drawn 2-3 days prior to your appointment so we can discuss the results during your office visit.

## 2021-04-05 NOTE — Assessment & Plan Note (Addendum)
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

## 2021-04-05 NOTE — Progress Notes (Signed)
Established Patient Office Visit  Subjective:  Patient ID: Isaiah Warren, male    DOB: 1976-03-03  Age: 45 y.o. MRN: [id number]  CC:  Chief Complaint  Patient presents with  . Annual Exam    CPE w/ EKG     HPI Isaiah Warren presents for physical exam. At his last OV, he stated that he was having intermittent chest pain. He states that he got some chest tightness 2-3 days ago, and he had to burp to relive the pain. His pain is upper abdominal, but he denies SOB. Sometimes it lasts 5 minutes, and sometimes it lasts 30 minutes.  Past Medical History:  Diagnosis Date  . History of chest pain     Past Surgical History:  Procedure Laterality Date  . none      Family History  Problem Relation Age of Onset  . Throat cancer Mother   . Diabetes Father   . Prostate cancer Father   . Hypertension Father     Social History   Socioeconomic History  . Marital status: Single    Spouse name: Not on file  . Number of children: 3  . Years of education: Not on file  . Highest education level: Not on file  Occupational History  . Occupation: Unemployed  Tobacco Use  . Smoking status: Current Every Day Smoker    Packs/day: 0.50    Years: 26.00    Pack years: 13.00    Types: Cigarettes  . Smokeless tobacco: Never Used  . Tobacco comment: 2-3 cigarettes daily   Vaping Use  . Vaping Use: Never used  Substance and Sexual Activity  . Alcohol use: Yes    Alcohol/week: 2.0 standard drinks    Types: 2 Cans of beer per week    Comment: "couple beers on the weekend"  . Drug use: No  . Sexual activity: Not Currently  Other Topics Concern  . Not on file  Social History Narrative   2 children at home and 1 is a Multimedia programmer Strain: Not on file  Food Insecurity: Not on file  Transportation Needs: Not on file  Physical Activity: Not on file  Stress: Not on file  Social Connections: Not on file  Intimate Partner Violence: Not on  file    Outpatient Medications Prior to Visit  Medication Sig Dispense Refill  . atorvastatin (LIPITOR) 40 MG tablet Take 1 tablet (40 mg total) by mouth daily. 90 tablet 3   No facility-administered medications prior to visit.    No Known Allergies  ROS Review of Systems  Constitutional: Negative.   HENT: Negative.   Eyes: Negative.   Respiratory: Negative.   Cardiovascular: Negative.   Gastrointestinal: Positive for abdominal pain.  Endocrine: Negative.   Genitourinary: Negative.   Musculoskeletal: Negative.   Skin: Negative.   Allergic/Immunologic: Negative.   Neurological: Negative.   Hematological: Negative.   Psychiatric/Behavioral: Negative.       Objective:    Physical Exam Constitutional:      Appearance: Normal appearance.  HENT:     Head: Normocephalic and atraumatic.     Right Ear: Tympanic membrane, ear canal and external ear normal.     Left Ear: Tympanic membrane, ear canal and external ear normal.     Nose: Nose normal.     Mouth/Throat:     Mouth: Mucous membranes are moist.     Pharynx: Oropharynx is clear.  Eyes:  Extraocular Movements: Extraocular movements intact.     Conjunctiva/sclera: Conjunctivae normal.     Pupils: Pupils are equal, round, and reactive to light.  Cardiovascular:     Rate and Rhythm: Normal rate and regular rhythm.     Pulses: Normal pulses.     Heart sounds: Normal heart sounds.  Pulmonary:     Effort: Pulmonary effort is normal.     Breath sounds: Normal breath sounds.  Abdominal:     General: Abdomen is flat. Bowel sounds are normal.     Palpations: Abdomen is soft.  Musculoskeletal:        General: Normal range of motion.     Cervical back: Normal range of motion and neck supple.  Skin:    General: Skin is warm and dry.     Capillary Refill: Capillary refill takes less than 2 seconds.  Neurological:     Mental Status: He is alert and oriented to person, place, and time.     Cranial Nerves: No cranial  nerve deficit.     Sensory: No sensory deficit.     Motor: No weakness.     Coordination: Coordination normal.     Gait: Gait normal.  Psychiatric:        Mood and Affect: Mood normal.        Behavior: Behavior normal.        Thought Content: Thought content normal.        Judgment: Judgment normal.     BP 122/81   Pulse 88   Temp 97.6 F (36.4 C)   Resp 20   Ht 5' 9"  (1.753 m)   Wt 194 lb (88 kg)   SpO2 97%   BMI 28.65 kg/m  Wt Readings from Last 3 Encounters:  04/05/21 194 lb (88 kg)  03/07/21 192 lb (87.1 kg)  12/07/20 165 lb (74.8 kg)     Health Maintenance Due  Topic Date Due  . TETANUS/TDAP  Never done  . COLONOSCOPY (Pts 45-30yr Insurance coverage will need to be confirmed)  Never done    There are no preventive care reminders to display for this patient.  No results found for: TSH Lab Results  Component Value Date   WBC 7.3 03/07/2021   HGB 13.1 03/07/2021   HCT 39.0 03/07/2021   MCV 79 03/07/2021   PLT 322 03/07/2021   Lab Results  Component Value Date   NA 143 03/07/2021   K 4.2 03/07/2021   CO2 19 (L) 03/07/2021   GLUCOSE 94 03/07/2021   BUN 8 03/07/2021   CREATININE 0.95 03/07/2021   BILITOT 0.4 03/07/2021   ALKPHOS 124 (H) 03/07/2021   AST 14 03/07/2021   ALT 18 03/07/2021   PROT 7.5 03/07/2021   ALBUMIN 4.6 03/07/2021   CALCIUM 9.7 03/07/2021   ANIONGAP 6 10/06/2020   EGFR 101 03/07/2021   Lab Results  Component Value Date   CHOL 282 (H) 03/07/2021   Lab Results  Component Value Date   HDL 49 03/07/2021   Lab Results  Component Value Date   LDLCALC 216 (H) 03/07/2021   Lab Results  Component Value Date   TRIG 98 03/07/2021   No results found for: CHOLHDL No results found for: HGBA1C    Assessment & Plan:   Problem List Items Addressed This Visit      Other   Atypical chest pain    -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 mylanta PRN      Relevant Medications   alum & mag hydroxide-simeth (MYLANTA) 200-200-20 MG/5ML suspension   Hyperlipidemia    -may have familial component Lab Results  Component Value Date   CHOL 282 (H) 03/07/2021   HDL 49 03/07/2021   LDLCALC 216 (H) 03/07/2021   TRIG 98 03/07/2021  -started him on statin when we got lab results -if no improvement in 3 months, will consider dose increase vs adding repatha       Relevant Orders   CBC with Differential/Platelet   CMP14+EGFR   Lipid Panel With LDL/HDL Ratio    Other Visit Diagnoses    Colon cancer screening    -  Primary   Relevant Orders   Cologuard      Meds ordered this encounter  Medications  . alum & mag hydroxide-simeth (MYLANTA) 200-200-20 MG/5ML suspension    Sig: Take 15 mLs by mouth every 6 (six) hours as needed for indigestion or heartburn.    Dispense:  355 mL    Refill:  0    Follow-up: Return in about 3 months (around 07/06/2021) for Lab follow-up (lipids).    Noreene Larsson, NP

## 2021-04-05 NOTE — Assessment & Plan Note (Signed)
-  intermittent upper abdominal pain that is relieved by belching  -Rx. mylanta PRN

## 2021-04-05 NOTE — Assessment & Plan Note (Signed)
-  may have familial component Lab Results  Component Value Date   CHOL 282 (H) 03/07/2021   HDL 49 03/07/2021   LDLCALC 216 (H) 03/07/2021   TRIG 98 03/07/2021  -started him on statin when we got lab results -if no improvement in 3 months, will consider dose increase vs adding repatha

## 2021-04-17 DIAGNOSIS — Z1211 Encounter for screening for malignant neoplasm of colon: Secondary | ICD-10-CM | POA: Diagnosis not present

## 2021-04-17 LAB — COLOGUARD
Cologuard: NEGATIVE
Cologuard: NEGATIVE

## 2021-04-26 ENCOUNTER — Other Ambulatory Visit: Payer: Self-pay

## 2021-04-26 ENCOUNTER — Encounter: Payer: Self-pay | Admitting: Student

## 2021-04-26 ENCOUNTER — Encounter: Payer: Self-pay | Admitting: *Deleted

## 2021-04-26 ENCOUNTER — Ambulatory Visit (INDEPENDENT_AMBULATORY_CARE_PROVIDER_SITE_OTHER): Payer: Medicaid Other | Admitting: Student

## 2021-04-26 VITALS — BP 124/82 | HR 90 | Ht 69.0 in | Wt 190.0 lb

## 2021-04-26 DIAGNOSIS — R079 Chest pain, unspecified: Secondary | ICD-10-CM

## 2021-04-26 DIAGNOSIS — Z720 Tobacco use: Secondary | ICD-10-CM | POA: Diagnosis not present

## 2021-04-26 DIAGNOSIS — E7800 Pure hypercholesterolemia, unspecified: Secondary | ICD-10-CM | POA: Diagnosis not present

## 2021-04-26 NOTE — Progress Notes (Addendum)
Cardiology Office Note    Date:  04/26/2021   ID:  Isaiah Warren, DOB [birth year], MRN [id number]  PCP:  Noreene Larsson, NP  Cardiologist: Jenkins Rouge, MD    Chief Complaint  Patient presents with   Follow-up    Recent chest pain    History of Present Illness:    Isaiah Warren is a 45 y.o. male with past medical history of HLD, atypical chest pain, family history of CAD and tobacco use who presents to the office today for evaluation of chest pain.  He was last examined by Dr. Johnsie Cancel in 03/2020 for follow-up from a recent Emergency Department visit for chest pain during which troponin values had been negative and symptoms were felt to be atypical for a cardiac etiology. Given his tobacco use and family history of CAD, it was recommended that he have an exercise tolerance test but this was never performed.  He was evaluated by his PCP in the interim and reported intermittent episodes of chest pain, therefore he was referred back to Cardiology for further evaluation  In talking with the patient today, he reports having intermittent episodes of chest discomfort over the past year and says his symptoms are very sporadic and can occur a few times in a week or a few times in the month. He describes it as a pressure along his chest which lasts for a few minutes and spontaneously resolves. Symptoms can occur at rest or with activity. No association with positional changes or food consumption. 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 His heart rate was elevated in the 110's when initially checked but improved into the 90's on recheck. He did report walking from the main parking lot of the hospital to the office given no closer parking options.  He does continue to smoke 1 pack/day.  Past Medical History:  Diagnosis Date   History of chest pain    Hyperlipidemia     Past Surgical History:  Procedure Laterality Date    none      Current Medications: Outpatient Medications Prior to Visit  Medication Sig Dispense Refill   alum & mag hydroxide-simeth (MYLANTA) 200-200-20 MG/5ML suspension Take 15 mLs by mouth every 6 (six) hours as needed for indigestion or heartburn. 355 mL 0   atorvastatin (LIPITOR) 40 MG tablet Take 1 tablet (40 mg total) by mouth daily. 90 tablet 3   ibuprofen (ADVIL) 800 MG tablet Take by mouth.     No facility-administered medications prior to visit.     Allergies:   Patient has no known allergies.   Social History   Socioeconomic History   Marital status: Single    Spouse name: Not on file   Number of children: 3   Years of education: Not on file   Highest education level: Not on file  Occupational History   Occupation: Unemployed  Tobacco Use   Smoking status: Every Day    Packs/day: 1.00    Years: 26.00    Pack years: 26.00    Types: Cigarettes   Smokeless tobacco: Never   Tobacco comments:    2-3 cigarettes daily   Vaping Use   Vaping Use: Never used  Substance and Sexual Activity   Alcohol use: Yes    Alcohol/week: 2.0 standard drinks    Types: 2 Cans of beer per week    Comment: "couple beers on the weekend"   Drug use:  No   Sexual activity: Not Currently  Other Topics Concern   Not on file  Social History Narrative   2 children at home and 1 is a Psychologist, 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   Review of Systems:    Please see the history of present illness.     All other systems reviewed and are otherwise negative except as noted above.   Physical Exam:    VS:  BP 124/82   Pulse 90    Ht 5' 9"  (1.753 m)   Wt 190 lb (86.2 kg)   SpO2 98%   BMI 28.06 kg/m    General: Well developed, well nourished,male appearing in no acute distress. Head: Normocephalic, atraumatic. Neck: No carotid bruits. JVD not elevated.  Lungs: Respirations regular and unlabored, without wheezes or rales.  Heart: Regular rate and rhythm. No S3 or S4.  No murmur, no rubs, or gallops appreciated. Abdomen: Appears non-distended. No obvious abdominal masses. Msk:  Strength and tone appear normal for age. No obvious joint deformities or effusions. Extremities: No clubbing or cyanosis. No lower extremity edema.  Distal pedal pulses are 2+ bilaterally. Neuro: Alert and oriented X 3. Moves all extremities spontaneously. No focal deficits noted. Psych:  Responds to questions appropriately with a normal affect. Skin: No rashes or lesions noted  Wt Readings from Last 3 Encounters:  04/26/21 190 lb (86.2 kg)  04/05/21 194 lb (88 kg)  03/07/21 192 lb (87.1 kg)     Studies/Labs Reviewed:   EKG:  EKG is not ordered today. EKG from 04/05/2021 is reviewed and shows NSR, HR 70 with no acute ST changes.   Recent Labs: 03/07/2021: ALT 18; BUN 8; Creatinine, Ser 0.95; Hemoglobin 13.1; Platelets 322; Potassium 4.2; Sodium 143   Lipid Panel    Component Value Date/Time   CHOL 282 (H) 03/07/2021 1020   TRIG 98 03/07/2021 1020   HDL 49 03/07/2021 1020   LDLCALC 216 (H) 03/07/2021 1020    Additional studies/ records that were reviewed today include:   CXR: 09/2020 FINDINGS: The heart size and mediastinal contours are within normal limits. Both lungs are clear. No visible pleural effusions or pneumothorax. No acute osseous abnormality.   IMPRESSION: No active cardiopulmonary disease.  Assessment:    1. Chest pain of uncertain etiology   2. Chest pain, unspecified type   3. Pure hypercholesterolemia   4. Tobacco use      Plan:   In order of problems listed above:  1. Chest Pain with Mixed  Features - His episodes of chest discomfort can occur at rest or with activity and he was able to push mow yards earlier this week without any symptoms but sometimes does experience symptoms with exertion. - Given his cardiac risk factors (HLD, tobacco use and family history of CAD), will plan to obtain a Treadmill Myoview for ischemic evaluation.   2. HLD - FLP in 02/2021 showed total cholesterol 282, triglycerides 98, LDL 216 and HDL 49. He was started on Atorvastatin 40 mg daily by his PCP. He is scheduled for repeat labs in 2 months  3. Tobacco use  - He currently smokes 1 pack/day. Cessation was advised.     Shared Decision Making/Informed Consent:   Shared Decision Making/Informed Consent The risks [chest pain, shortness of breath, cardiac arrhythmias, dizziness, blood pressure fluctuations, myocardial infarction, stroke/transient ischemic attack, nausea, vomiting, allergic reaction, radiation exposure, metallic taste sensation and life-threatening complications (estimated to be 1 in 10,000)], benefits (risk stratification, diagnosing coronary artery disease, treatment guidance) and alternatives of a nuclear stress test were discussed in detail with Mr. Sherk and he agrees to proceed.    Medication Adjustments/Labs and Tests Ordered: Current medicines are reviewed at length with the patient today.  Concerns regarding medicines are outlined above.  Medication changes, Labs and Tests ordered today are listed in the Patient Instructions below. Patient Instructions  Medication Instructions:  Your physician recommends that you continue on your current medications as directed. Please refer to the Current Medication list given to you today.  *If you need a refill on your cardiac medications before your next appointment, please call your pharmacy*   Lab Work: NONE   If you have labs (blood work) drawn today and your tests are completely normal, you will receive your results only  by: [place] (if you have MyChart) OR A paper copy in the mail If you have any lab test that is abnormal or we need to change your treatment, we will call you to review the results.   Testing/Procedures: Your physician has requested that you have en exercise stress myoview. For further information please visit [email or web address]. Please follow instruction sheet, as given.    Follow-Up: At [facility], you and your health needs are our priority.  As part of our continuing mission to provide you with exceptional heart care, we have created designated Provider Care Teams.  These Care Teams include your primary Cardiologist (physician) and Advanced Practice Providers (APPs -  Physician Assistants and Nurse Practitioners) who all work together to provide you with the care you need, when you need it.  We recommend signing up for the patient portal called "MyChart".  Sign up information is provided on this After Visit Summary.  MyChart is used to connect with patients for Virtual Visits (Telemedicine).  Patients are able to view lab/test results, encounter notes, upcoming appointments, etc.  Non-urgent messages can be sent to your provider as well.   To learn more about what you can do with MyChart, go to [email or web address].    Your next appointment:   6 month(s)  The format for your next appointment:   In Person  Provider:   Jenkins Rouge, MD   Other Instructions Thank you for choosing [facility]!     Signed, Erma Heritage, PA-C  04/26/2021 4:33 PM    [facility] [address] [place], [place] [address] Phone: [phone] Fax: [phone]

## 2021-04-26 NOTE — Patient Instructions (Signed)
Medication Instructions:  Your physician recommends that you continue on your current medications as directed. Please refer to the Current Medication list given to you today.  *If you need a refill on your cardiac medications before your next appointment, please call your pharmacy*   Lab Work: NONE   If you have labs (blood work) drawn today and your tests are completely normal, you will receive your results only by: [place] (if you have MyChart) OR A paper copy in the mail If you have any lab test that is abnormal or we need to change your treatment, we will call you to review the results.   Testing/Procedures: Your physician has requested that you have en exercise stress myoview. For further information please visit [email or web address]. Please follow instruction sheet, as given.    Follow-Up: At [facility], you and your health needs are our priority.  As part of our continuing mission to provide you with exceptional heart care, we have created designated Provider Care Teams.  These Care Teams include your primary Cardiologist (physician) and Advanced Practice Providers (APPs -  Physician Assistants and Nurse Practitioners) who all work together to provide you with the care you need, when you need it.  We recommend signing up for the patient portal called "MyChart".  Sign up information is provided on this After Visit Summary.  MyChart is used to connect with patients for Virtual Visits (Telemedicine).  Patients are able to view lab/test results, encounter notes, upcoming appointments, etc.  Non-urgent messages can be sent to your provider as well.   To learn more about what you can do with MyChart, go to [email or web address].    Your next appointment:   6 month(s)  The format for your next appointment:   In Person  Provider:   Jenkins Rouge, MD   Other Instructions Thank you for choosing [facility]!

## 2021-05-10 ENCOUNTER — Encounter (HOSPITAL_COMMUNITY): Payer: Self-pay

## 2021-05-10 ENCOUNTER — Ambulatory Visit (HOSPITAL_COMMUNITY)
Admission: RE | Admit: 2021-05-10 | Discharge: 2021-05-10 | Disposition: A | Discharge status: Home / Self Care | Payer: Medicaid Other | Source: Ambulatory Visit | Attending: Student | Admitting: Student

## 2021-05-10 ENCOUNTER — Other Ambulatory Visit: Payer: Self-pay

## 2021-05-10 DIAGNOSIS — R079 Chest pain, unspecified: Secondary | ICD-10-CM

## 2021-05-10 LAB — NM MYOCAR MULTI W/SPECT W/WALL MOTION / EF
Estimated workload: 7 METS
Exercise duration (min): 6 min
Exercise duration (sec): 26 s
LV dias vol: 112 mL (ref 62–150)
LV sys vol: 47 mL
MPHR: 175 {beats}/min
Peak HR: 155 {beats}/min
Percent HR: 88 %
RATE: 0.36
RPE: 13
Rest HR: 66 {beats}/min
SDS: 1
SRS: 0
SSS: 1
TID: 0.96

## 2021-05-10 MED ORDER — REGADENOSON 0.4 MG/5ML IV SOLN
INTRAVENOUS | Status: AC
  Filled 2021-05-10: qty 5

## 2021-05-10 MED ORDER — TECHNETIUM TC 99M TETROFOSMIN IV KIT
10.0000 | PACK | Freq: Once | INTRAVENOUS | Status: AC | PRN
  Administered 2021-05-10: 11 via INTRAVENOUS

## 2021-05-10 MED ORDER — SODIUM CHLORIDE FLUSH 0.9 % IV SOLN
INTRAVENOUS | Status: AC
  Administered 2021-05-10: 10 mL via INTRAVENOUS
  Filled 2021-05-10: qty 10

## 2021-05-10 MED ORDER — TECHNETIUM TC 99M TETROFOSMIN IV KIT
30.0000 | PACK | Freq: Once | INTRAVENOUS | Status: AC | PRN
  Administered 2021-05-10: 32 via INTRAVENOUS

## 2021-07-06 ENCOUNTER — Other Ambulatory Visit: Payer: Self-pay

## 2021-07-06 ENCOUNTER — Ambulatory Visit: Payer: Medicaid Other | Admitting: Nurse Practitioner

## 2021-07-06 ENCOUNTER — Encounter: Payer: Self-pay | Admitting: Nurse Practitioner

## 2021-07-06 DIAGNOSIS — R0789 Other chest pain: Secondary | ICD-10-CM | POA: Diagnosis not present

## 2021-07-06 DIAGNOSIS — E7800 Pure hypercholesterolemia, unspecified: Secondary | ICD-10-CM

## 2021-07-06 NOTE — Assessment & Plan Note (Signed)
-  he had stress test that was low-risk

## 2021-07-06 NOTE — Progress Notes (Signed)
Acute Office Visit  Subjective:    Patient ID: Isaiah Warren, male    DOB: [birth year], 45 y.o.   MRN: [id number]  Chief Complaint  Patient presents with   Follow-up    HPI Patient is in today for follow-up for HLD. He was started on atorvastatin at his last OV, and LDL was 216 at that time. NO adverse med effects.  Past Medical History:  Diagnosis Date   History of chest pain    Hyperlipidemia     Past Surgical History:  Procedure Laterality Date   none      Family History  Problem Relation Age of Onset   Throat cancer Mother    Diabetes Father    Prostate cancer Father    Hypertension Father     Social History   Socioeconomic History   Marital status: Single    Spouse name: Not on file   Number of children: 3   Years of education: Not on file   Highest education level: Not on file  Occupational History   Occupation: Unemployed  Tobacco Use   Smoking status: Every Day    Packs/day: 1.00    Years: 26.00    Pack years: 26.00    Types: Cigarettes   Smokeless tobacco: Never   Tobacco comments:    2-3 cigarettes daily   Vaping Use   Vaping Use: Never used  Substance and Sexual Activity   Alcohol use: Yes    Alcohol/week: 2.0 standard drinks    Types: 2 Cans of beer per week    Comment: "couple beers on the weekend"   Drug use: No   Sexual activity: Not Currently  Other Topics Concern   Not on file  Social History Narrative   2 children at home and 1 is a Psychologist, prison and probation services of Radio broadcast assistant Strain: Not on file  Food Insecurity: Not on file  Transportation Needs: Not on file  Physical Activity: Not on file  Stress: Not on file  Social Connections: Not on file  Intimate Partner Violence: Not on file    Outpatient Medications Prior to Visit  Medication Sig Dispense Refill   alum & mag hydroxide-simeth (MYLANTA) 200-200-20 MG/5ML suspension Take 15 mLs by mouth every 6 (six) hours as needed for indigestion or heartburn.  355 mL 0   atorvastatin (LIPITOR) 40 MG tablet Take 1 tablet (40 mg total) by mouth daily. 90 tablet 3   ibuprofen (ADVIL) 800 MG tablet Take by mouth.     No facility-administered medications prior to visit.    No Known Allergies  Review of Systems  Constitutional: Negative.   Respiratory: Negative.    Cardiovascular: Negative.   Musculoskeletal: Negative.   Psychiatric/Behavioral: Negative.        Objective:    Physical Exam Constitutional:      Appearance: Normal appearance.  Cardiovascular:     Rate and Rhythm: Normal rate and regular rhythm.     Pulses: Normal pulses.     Heart sounds: Normal heart sounds.  Pulmonary:     Effort: Pulmonary effort is normal.     Breath sounds: Normal breath sounds.  Neurological:     Mental Status: He is alert.  Psychiatric:        Mood and Affect: Mood normal.        Behavior: Behavior normal.        Thought Content: Thought content normal.        Judgment: Judgment  normal.    BP 111/76 (BP Location: Left Arm, Patient Position: Sitting, Cuff Size: Large)   Pulse 82   Temp 98.2 F (36.8 C) (Oral)   Ht _0  (1.753 m)   Wt 186 lb (84.4 kg)   SpO2 96%   BMI 27.47 kg/m  Wt Readings from Last 3 Encounters:  07/06/21 186 lb (84.4 kg)  04/26/21 190 lb (86.2 kg)  04/05/21 194 lb (88 kg)    There are no preventive care reminders to display for this patient.  There are no preventive care reminders to display for this patient.   No results found for: TSH Lab Results  Component Value Date   WBC 7.3 03/07/2021   HGB 13.1 03/07/2021   HCT 39.0 03/07/2021   MCV 79 03/07/2021   PLT 322 03/07/2021   Lab Results  Component Value Date   NA 143 03/07/2021   K 4.2 03/07/2021   CO2 19 (L) 03/07/2021   GLUCOSE 94 03/07/2021   BUN 8 03/07/2021   CREATININE 0.95 03/07/2021   BILITOT 0.4 03/07/2021   ALKPHOS 124 (H) 03/07/2021   AST 14 03/07/2021   ALT 18 03/07/2021   PROT 7.5 03/07/2021   ALBUMIN 4.6 03/07/2021   CALCIUM  9.7 03/07/2021   ANIONGAP 6 10/06/2020   EGFR 101 03/07/2021   Lab Results  Component Value Date   CHOL 282 (H) 03/07/2021   Lab Results  Component Value Date   HDL 49 03/07/2021   Lab Results  Component Value Date   LDLCALC 216 (H) 03/07/2021   Lab Results  Component Value Date   TRIG 98 03/07/2021   No results found for: CHOLHDL No results found for: HGBA1C     Assessment & Plan:   Problem List Items Addressed This Visit       Other   Atypical chest pain    -he had stress test that was low-risk      Hyperlipidemia    -has been taking atorvastatin daily without adverse med effects -checking lipid panel today -if LDL still elevated, may consider increasing atorvastatin vs repatha      Relevant Orders   CBC with Differential/Platelet   CMP14+EGFR   Lipid Panel With LDL/HDL Ratio     No orders of the defined types were placed in this encounter.    Noreene Larsson, NP

## 2021-07-06 NOTE — Patient Instructions (Signed)
Please have fasting labs drawn today.

## 2021-07-06 NOTE — Assessment & Plan Note (Addendum)
-  has been taking atorvastatin daily without adverse med effects -checking lipid panel today -if LDL still elevated, may consider increasing atorvastatin vs repatha

## 2021-07-07 ENCOUNTER — Other Ambulatory Visit: Payer: Self-pay | Admitting: Nurse Practitioner

## 2021-07-07 DIAGNOSIS — E7800 Pure hypercholesterolemia, unspecified: Secondary | ICD-10-CM

## 2021-07-07 LAB — CMP14+EGFR
ALT: 20 IU/L (ref 0–44)
AST: 15 IU/L (ref 0–40)
Albumin/Globulin Ratio: 1.7 (ref 1.2–2.2)
Albumin: 4.7 g/dL (ref 4.0–5.0)
Alkaline Phosphatase: 129 IU/L — ABNORMAL HIGH (ref 44–121)
BUN/Creatinine Ratio: 13 (ref 9–20)
BUN: 12 mg/dL (ref 6–24)
Bilirubin Total: 0.4 mg/dL (ref 0.0–1.2)
CO2: 22 mmol/L (ref 20–29)
Calcium: 9.7 mg/dL (ref 8.7–10.2)
Chloride: 103 mmol/L (ref 96–106)
Creatinine, Ser: 0.91 mg/dL (ref 0.76–1.27)
Globulin, Total: 2.7 g/dL (ref 1.5–4.5)
Glucose: 100 mg/dL — ABNORMAL HIGH (ref 65–99)
Potassium: 4.2 mmol/L (ref 3.5–5.2)
Sodium: 138 mmol/L (ref 134–144)
Total Protein: 7.4 g/dL (ref 6.0–8.5)
eGFR: 106 mL/min/{1.73_m2} (ref >59)

## 2021-07-07 LAB — CBC WITH DIFFERENTIAL/PLATELET
Basophils Absolute: 0 10*3/uL (ref 0.0–0.2)
Basos: 0 %
EOS (ABSOLUTE): 0.2 10*3/uL (ref 0.0–0.4)
Eos: 3 %
Hematocrit: 37.5 % (ref 37.5–51.0)
Hemoglobin: 12.6 g/dL — ABNORMAL LOW (ref 13.0–17.7)
Immature Grans (Abs): 0 10*3/uL (ref 0.0–0.1)
Immature Granulocytes: 0 %
Lymphocytes Absolute: 3.1 10*3/uL (ref 0.7–3.1)
Lymphs: 42 %
MCH: 27 pg (ref 26.6–33.0)
MCHC: 33.6 g/dL (ref 31.5–35.7)
MCV: 80 fL (ref 79–97)
Monocytes Absolute: 0.7 10*3/uL (ref 0.1–0.9)
Monocytes: 10 %
Neutrophils Absolute: 3.4 10*3/uL (ref 1.4–7.0)
Neutrophils: 45 %
Platelets: 318 10*3/uL (ref 150–450)
RBC: 4.67 x10E6/uL (ref 4.14–5.80)
RDW: 16.3 % — ABNORMAL HIGH (ref 11.6–15.4)
WBC: 7.4 10*3/uL (ref 3.4–10.8)

## 2021-07-07 LAB — LIPID PANEL WITH LDL/HDL RATIO
Cholesterol, Total: 190 mg/dL (ref 100–199)
HDL: 53 mg/dL (ref >39)
LDL Chol Calc (NIH): 124 mg/dL — ABNORMAL HIGH (ref 0–99)
LDL/HDL Ratio: 2.3 ratio (ref 0.0–3.6)
Triglycerides: 71 mg/dL (ref 0–149)
VLDL Cholesterol Cal: 13 mg/dL (ref 5–40)

## 2021-07-07 MED ORDER — ATORVASTATIN CALCIUM 80 MG PO TABS: 80.0000 mg | ORAL_TABLET | Freq: Every day | ORAL | 3 refills | Status: DC

## 2021-07-07 NOTE — Progress Notes (Signed)
LDL is much improved at 124 (from 216). I'll increase the dose of atorvastatin, as we want that LDL less than 100.

## 2021-10-04 ENCOUNTER — Ambulatory Visit: Payer: Medicaid Other | Admitting: Nurse Practitioner

## 2021-10-12 ENCOUNTER — Encounter (INDEPENDENT_AMBULATORY_CARE_PROVIDER_SITE_OTHER): Payer: Self-pay

## 2021-10-12 ENCOUNTER — Other Ambulatory Visit: Payer: Self-pay

## 2021-10-12 ENCOUNTER — Ambulatory Visit (INDEPENDENT_AMBULATORY_CARE_PROVIDER_SITE_OTHER): Payer: Medicaid Other | Admitting: Nurse Practitioner

## 2021-10-12 ENCOUNTER — Encounter: Payer: Self-pay | Admitting: Nurse Practitioner

## 2021-10-12 VITALS — BP 151/96 | HR 94 | Ht 69.0 in | Wt 198.0 lb

## 2021-10-12 DIAGNOSIS — K219 Gastro-esophageal reflux disease without esophagitis: Secondary | ICD-10-CM

## 2021-10-12 DIAGNOSIS — E7800 Pure hypercholesterolemia, unspecified: Secondary | ICD-10-CM | POA: Diagnosis not present

## 2021-10-12 MED ORDER — OMEPRAZOLE 40 MG PO CPDR: 40.0000 mg | DELAYED_RELEASE_CAPSULE | Freq: Every day | ORAL | 3 refills | Status: AC

## 2021-10-12 MED ORDER — EZETIMIBE 10 MG PO TABS: 10.0000 mg | ORAL_TABLET | Freq: Every day | ORAL | 3 refills | Status: AC

## 2021-10-12 MED ORDER — ATORVASTATIN CALCIUM 40 MG PO TABS: 40.0000 mg | ORAL_TABLET | Freq: Every day | ORAL | 3 refills | Status: AC

## 2021-10-12 NOTE — Patient Instructions (Signed)
Please have fasting labs drawn at your earliest convenience.  I will be moving to [facility] located at [address], [place], [place] [address] effective Oct 29, 2021. If you would like to establish care with [facility] please call [phone].

## 2021-10-12 NOTE — Progress Notes (Signed)
Acute Office Visit  Subjective:    Patient ID: Isaiah Warren, male    DOB: 29-May-1976, 45 y.o.   MRN: [id number]  Chief Complaint  Patient presents with   Follow-up    3 month lab follow up thinks atorvastatin is causing leg cramps and heartburn.    HPI Patient is in today for lab follow-up for HLD. At his last OV, he LDL was 124, and he has been taking max dose atorvastatin.  He has been having indigestion. He tried mylanta, and he states that Tums works better for him. He states that he has not been taking ibuprofen frequently.  Past Medical History:  Diagnosis Date   History of chest pain    Hyperlipidemia     Past Surgical History:  Procedure Laterality Date   none      Family History  Problem Relation Age of Onset   Throat cancer Mother    Diabetes Father    Prostate cancer Father    Hypertension Father     Social History   Socioeconomic History   Marital status: Single    Spouse name: Not on file   Number of children: 3   Years of education: Not on file   Highest education level: Not on file  Occupational History   Occupation: Unemployed  Tobacco Use   Smoking status: Every Day    Packs/day: 1.00    Years: 26.00    Pack years: 26.00    Types: Cigarettes   Smokeless tobacco: Never   Tobacco comments:    2-3 cigarettes daily   Vaping Use   Vaping Use: Never used  Substance and Sexual Activity   Alcohol use: Yes    Alcohol/week: 2.0 standard drinks    Types: 2 Cans of beer per week    Comment: "couple beers on the weekend"   Drug use: No   Sexual activity: Not Currently  Other Topics Concern   Not on file  Social History Narrative   2 children at home and 1 is a Psychologist, prison and probation services of [employer] broadcast assistant Strain: Not on file  Food Insecurity: Not on file  Transportation Needs: Not on file  Physical Activity: Not on file  Stress: Not on file  Social Connections: Not on file  Intimate Partner Violence: Not on file     Outpatient Medications Prior to Visit  Medication Sig Dispense Refill   alum & mag hydroxide-simeth (MYLANTA) 200-200-20 MG/5ML suspension Take 15 mLs by mouth every 6 (six) hours as needed for indigestion or heartburn. 355 mL 0   ibuprofen (ADVIL) 800 MG tablet Take by mouth.     atorvastatin (LIPITOR) 80 MG tablet Take 1 tablet (80 mg total) by mouth daily. 90 tablet 3   No facility-administered medications prior to visit.    No Known Allergies  Review of Systems  Constitutional: Negative.   Respiratory: Negative.    Cardiovascular: Negative.   Gastrointestinal:        Indigestion worse after increasing statin  Psychiatric/Behavioral: Negative.        Objective:    Physical Exam Constitutional:      Appearance: Normal appearance.  Cardiovascular:     Rate and Rhythm: Normal rate and regular rhythm.     Pulses: Normal pulses.     Heart sounds: Normal heart sounds.  Pulmonary:     Effort: Pulmonary effort is normal.     Breath sounds: Normal breath sounds.  Neurological:  Mental Status: He is alert.  Psychiatric:        Mood and Affect: Mood normal.        Behavior: Behavior normal.        Thought Content: Thought content normal.        Judgment: Judgment normal.    BP (!) 151/96    Pulse 94    Ht _0  (1.753 m)    Wt 198 lb 0.6 oz (89.8 kg)    SpO2 98%    BMI 29.25 kg/m  Wt Readings from Last 3 Encounters:  10/12/21 198 lb 0.6 oz (89.8 kg)  07/06/21 186 lb (84.4 kg)  04/26/21 190 lb (86.2 kg)    There are no preventive care reminders to display for this patient.   There are no preventive care reminders to display for this patient.   No results found for: TSH Lab Results  Component Value Date   WBC 7.4 07/06/2021   HGB 12.6 (L) 07/06/2021   HCT 37.5 07/06/2021   MCV 80 07/06/2021   PLT 318 07/06/2021   Lab Results  Component Value Date   NA 138 07/06/2021   K 4.2 07/06/2021   CO2 22 07/06/2021   GLUCOSE 100 (H) 07/06/2021   BUN 12  07/06/2021   CREATININE 0.91 07/06/2021   BILITOT 0.4 07/06/2021   ALKPHOS 129 (H) 07/06/2021   AST 15 07/06/2021   ALT 20 07/06/2021   PROT 7.4 07/06/2021   ALBUMIN 4.7 07/06/2021   CALCIUM 9.7 07/06/2021   ANIONGAP 6 10/06/2020   EGFR 106 07/06/2021   Lab Results  Component Value Date   CHOL 190 07/06/2021   Lab Results  Component Value Date   HDL 53 07/06/2021   Lab Results  Component Value Date   LDLCALC 124 (H) 07/06/2021   Lab Results  Component Value Date   TRIG 71 07/06/2021   No results found for: CHOLHDL No results found for: HGBA1C     Assessment & Plan:   Problem List Items Addressed This Visit       Digestive   GERD (gastroesophageal reflux disease)    -persistent despite mylanta and TUMS -Rx. Omeprazole -if no improvement, will consider GI consult      Relevant Medications   omeprazole (PRILOSEC) 40 MG capsule   Other Relevant Orders   CBC with Differential/Platelet     Other   Hyperlipidemia - Primary    -had increase in indigestion and leg cramps with max dose atorvastatin -DECREASE atorvastatin to 40 mg -Rx. zetia -if no improvement, will consider swapping to rosuvastatin      Relevant Medications   ezetimibe (ZETIA) 10 MG tablet   atorvastatin (LIPITOR) 40 MG tablet   Other Relevant Orders   CBC with Differential/Platelet   CMP14+EGFR   Lipid Panel With LDL/HDL Ratio     Meds ordered this encounter  Medications   omeprazole (PRILOSEC) 40 MG capsule    Sig: Take 1 capsule (40 mg total) by mouth daily.    Dispense:  30 capsule    Refill:  3   ezetimibe (ZETIA) 10 MG tablet    Sig: Take 1 tablet (10 mg total) by mouth daily.    Dispense:  90 tablet    Refill:  3   atorvastatin (LIPITOR) 40 MG tablet    Sig: Take 1 tablet (40 mg total) by mouth daily.    Dispense:  90 tablet    Refill:  [place],  NP

## 2021-10-12 NOTE — Assessment & Plan Note (Signed)
-  had increase in indigestion and leg cramps with max dose atorvastatin -DECREASE atorvastatin to 40 mg -Rx. zetia -if no improvement, will consider swapping to rosuvastatin

## 2021-10-12 NOTE — Assessment & Plan Note (Addendum)
-  persistent despite mylanta and TUMS -Rx. Omeprazole -if no improvement, will consider GI consult

## 2021-10-23 NOTE — Progress Notes (Signed)
Cardiology Office Note    Date:  10/27/2021   ID:  ELSTON ALDAPE, DOB 06-07-1976, MRN [id number]  PCP:  Renee Rival, FNP  Cardiologist: Jenkins Rouge, MD    No chief complaint on file.   History of Present Illness:    Isaiah Warren is a 45 y.o. male with past medical history of HLD, atypical chest pain, family history of CAD and tobacco use who presents to the office today for F/U  First seen by me  03/2020 for follow-up from a recent Emergency Department visit for chest pain during which troponin values had been negative and symptoms were felt to be atypical for a cardiac etiology. Given his tobacco use and family history of CAD, it was recommended that he have an exercise tolerance test but this was never performed.Pain is atypical not always exertional and sporadic with spontaneous resolution   He does continue to smoke 1 pack/day. Bothered by GERD uses tums and omeprazole   Had leg cramps with high dose lipitor Dose decreased to 40 mg and zetia added 10/12/21 LDL was 124 on 07/06/21   Had myovue with negative ECG and diaphragmatic attenuation 05/10/21  Discussed utility of calcium score to guide Rx of HLD Counseled on smoking cessation < 10 minutes   He has a 6 and 45 yo at home. Older son plays football for [employer] high   Past Medical History:  Diagnosis Date   History of chest pain    Hyperlipidemia     Past Surgical History:  Procedure Laterality Date   none      Current Medications: Outpatient Medications Prior to Visit  Medication Sig Dispense Refill   alum & mag hydroxide-simeth (MYLANTA) 200-200-20 MG/5ML suspension Take 15 mLs by mouth every 6 (six) hours as needed for indigestion or heartburn. 355 mL 0   atorvastatin (LIPITOR) 40 MG tablet Take 1 tablet (40 mg total) by mouth daily. 90 tablet 3   ezetimibe (ZETIA) 10 MG tablet Take 1 tablet (10 mg total) by mouth daily. 90 tablet 3   ibuprofen (ADVIL) 800 MG tablet Take by mouth.     omeprazole  (PRILOSEC) 40 MG capsule Take 1 capsule (40 mg total) by mouth daily. 30 capsule 3   No facility-administered medications prior to visit.     Allergies:   Patient has no known allergies.   Social History   Socioeconomic History   Marital status: Single    Spouse name: Not on file   Number of children: 3   Years of education: Not on file   Highest education level: Not on file  Occupational History   Occupation: Unemployed  Tobacco Use   Smoking status: Every Day    Packs/day: 1.00    Years: 26.00    Pack years: 26.00    Types: Cigarettes   Smokeless tobacco: Never   Tobacco comments:    2-3 cigarettes daily   Vaping Use   Vaping Use: Never used  Substance and Sexual Activity   Alcohol use: Yes    Alcohol/week: 2.0 standard drinks    Types: 2 Cans of beer per week    Comment: "couple beers on the weekend"   Drug use: No   Sexual activity: Not Currently  Other Topics Concern   Not on file  Social History Narrative   2 children at home and 1 is a marine   Social Determinants of Radio broadcast assistant Strain: Not on file  Food Insecurity: Not on  file  Transportation Needs: Not on file  Physical Activity: Not on file  Stress: Not on file  Social Connections: Not on file     Family History:  The patient's family history includes Diabetes in his father; Hypertension in his father; Prostate cancer in his father; Throat cancer in his mother.   Review of Systems:    Please see the history of present illness.     All other systems reviewed and are otherwise negative except as noted above.   Physical Exam:    VS:  BP 110/70 (BP Location: Left Arm, Patient Position: Sitting, Cuff Size: Normal)    Pulse 80    Ht 5' 9"  (1.753 m)    Wt 189 lb (85.7 kg)    SpO2 98%    BMI 27.91 kg/m    General: Well developed, well nourished,male appearing in no acute distress. Head: Normocephalic, atraumatic. Neck: No carotid bruits. JVD not elevated.  Lungs: Respirations regular  and unlabored, without wheezes or rales.  Heart: Regular rate and rhythm. No S3 or S4.  No murmur, no rubs, or gallops appreciated. Abdomen: Appears non-distended. No obvious abdominal masses. Msk:  Strength and tone appear normal for age. No obvious joint deformities or effusions. Extremities: No clubbing or cyanosis. No lower extremity edema.  Distal pedal pulses are 2+ bilaterally. Neuro: Alert and oriented X 3. Moves all extremities spontaneously. No focal deficits noted. Psych:  Responds to questions appropriately with a normal affect. Skin: No rashes or lesions noted  Wt Readings from Last 3 Encounters:  10/27/21 189 lb (85.7 kg)  10/12/21 198 lb 0.6 oz (89.8 kg)  07/06/21 186 lb (84.4 kg)     Studies/Labs Reviewed:   EKG:  EKG is not ordered today. EKG from 04/05/2021 is reviewed and shows NSR, HR 70 with no acute ST changes.   Recent Labs: 07/06/2021: ALT 20; BUN 12; Creatinine, Ser 0.91; Hemoglobin 12.6; Platelets 318; Potassium 4.2; Sodium 138   Lipid Panel    Component Value Date/Time   CHOL 190 07/06/2021 0822   TRIG 71 07/06/2021 0822   HDL 53 07/06/2021 0822   LDLCALC 124 (H) 07/06/2021 0822    Additional studies/ records that were reviewed today include:   CXR: 09/2020 FINDINGS: The heart size and mediastinal contours are within normal limits. Both lungs are clear. No visible pleural effusions or pneumothorax. No acute osseous abnormality.   IMPRESSION: No active cardiopulmonary disease.  Exercise Myovue 05/10/21  Study Result  Narrative & Impression  Blood pressure demonstrated a normal response to exercise. There was no ST segment deviation noted during stress. This is a low risk study. The left ventricular ejection fraction is normal (55-65%). Inferior defect with mild reversibility but normal wall motion. Findings likely secondary to differences in subdiaphragmatic attenuation, cannot completely exclude very mild inferior ischemia. Either finding would  support low risk, particularly considering low risk Duke treadmill score of 6.5     Plan:   In order of problems listed above:  1. Chest Pain with Mixed Features -   Stress myovue  05/10/21 with DTS 6.5 and diaphragmatic attenuation low risk EF 55-60% Discussed coronary calcium score to further risk stratify and guide Rx of HLD     2. HLD - on statin and zetia  labs with primary   3. Tobacco use  - He currently smokes 1 pack/day. Cessation was advised.  CXR 10/06/20 NAD too young for lung cancer screening CT will order CXR  4. GERD:  on omeprazole  and using tums     Medication Adjustments/Labs and Tests Ordered: Current medicines are reviewed at length with the patient today.  Concerns regarding medicines are outlined above.  Medication changes, Labs and Tests ordered today are listed in the Patient Instructions below. There are no Patient Instructions on file for this visit.    CXR Coronary calcium score   F/U in a year  Signed, Jenkins Rouge, MD  10/27/2021 9:05 AM    [facility]. [address] [place],  [address] Phone: [phone] Fax: [phone]

## 2021-10-27 ENCOUNTER — Ambulatory Visit (HOSPITAL_COMMUNITY)
Admission: RE | Admit: 2021-10-27 | Discharge: 2021-10-27 | Disposition: A | Discharge status: Home / Self Care | Payer: Medicaid Other | Source: Ambulatory Visit | Attending: Cardiovascular Disease | Admitting: Cardiovascular Disease

## 2021-10-27 ENCOUNTER — Encounter: Payer: Self-pay | Admitting: Cardiovascular Disease

## 2021-10-27 ENCOUNTER — Ambulatory Visit (INDEPENDENT_AMBULATORY_CARE_PROVIDER_SITE_OTHER): Payer: Medicaid Other | Admitting: Cardiovascular Disease

## 2021-10-27 ENCOUNTER — Other Ambulatory Visit: Payer: Self-pay

## 2021-10-27 VITALS — BP 110/70 | HR 80 | Ht 69.0 in | Wt 189.0 lb

## 2021-10-27 DIAGNOSIS — Z720 Tobacco use: Secondary | ICD-10-CM | POA: Insufficient documentation

## 2021-10-27 DIAGNOSIS — R079 Chest pain, unspecified: Secondary | ICD-10-CM

## 2021-10-27 DIAGNOSIS — E782 Mixed hyperlipidemia: Secondary | ICD-10-CM

## 2021-10-27 IMAGING — DX DG CHEST 2V
2 series · 2 of 2 positions shown · non-contrast
Comparison: [DATE]

CLINICAL DATA: 45-year-old male with a history of smoking and chest
pain

EXAM:
CHEST - 2 VIEW

[chest pa]
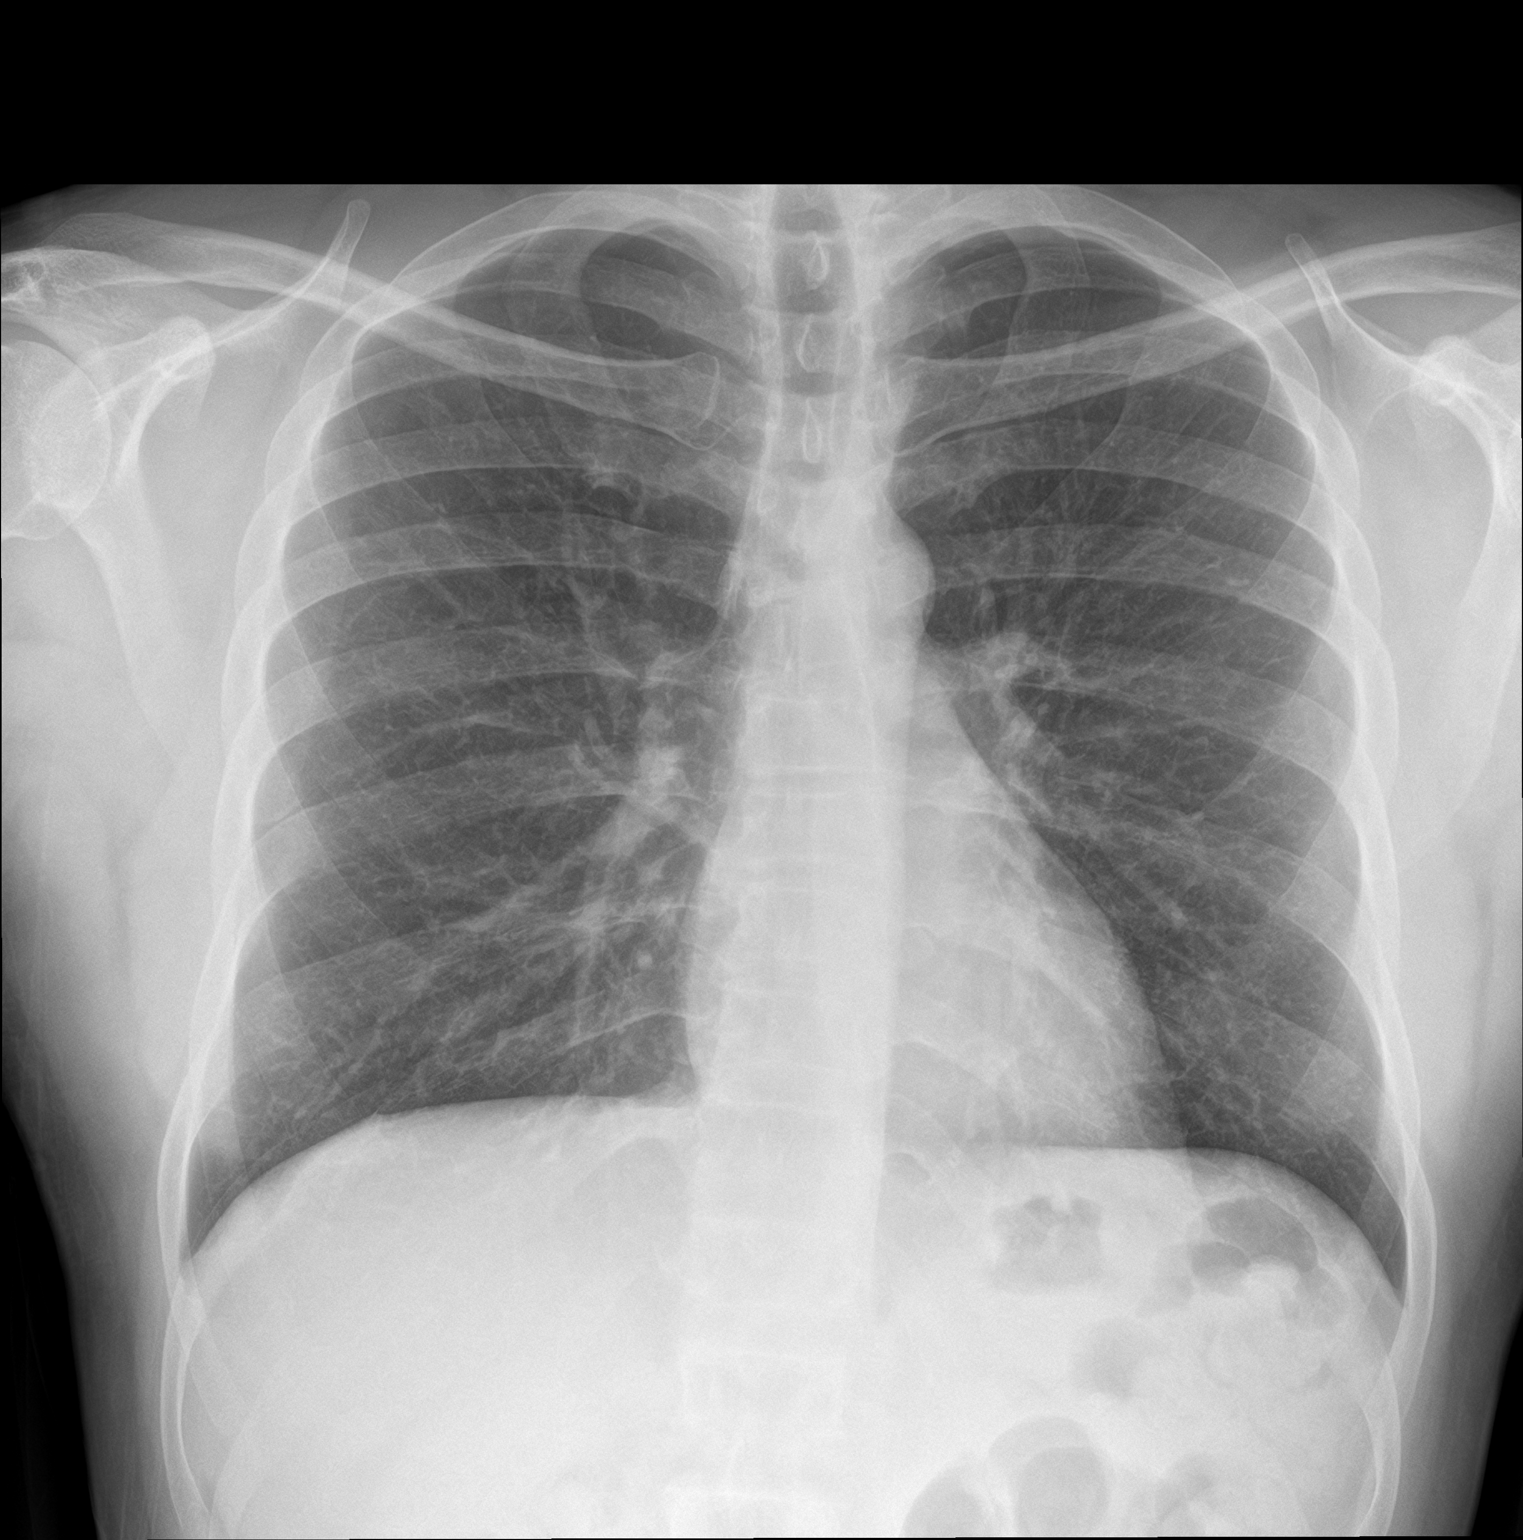

[chest lat]
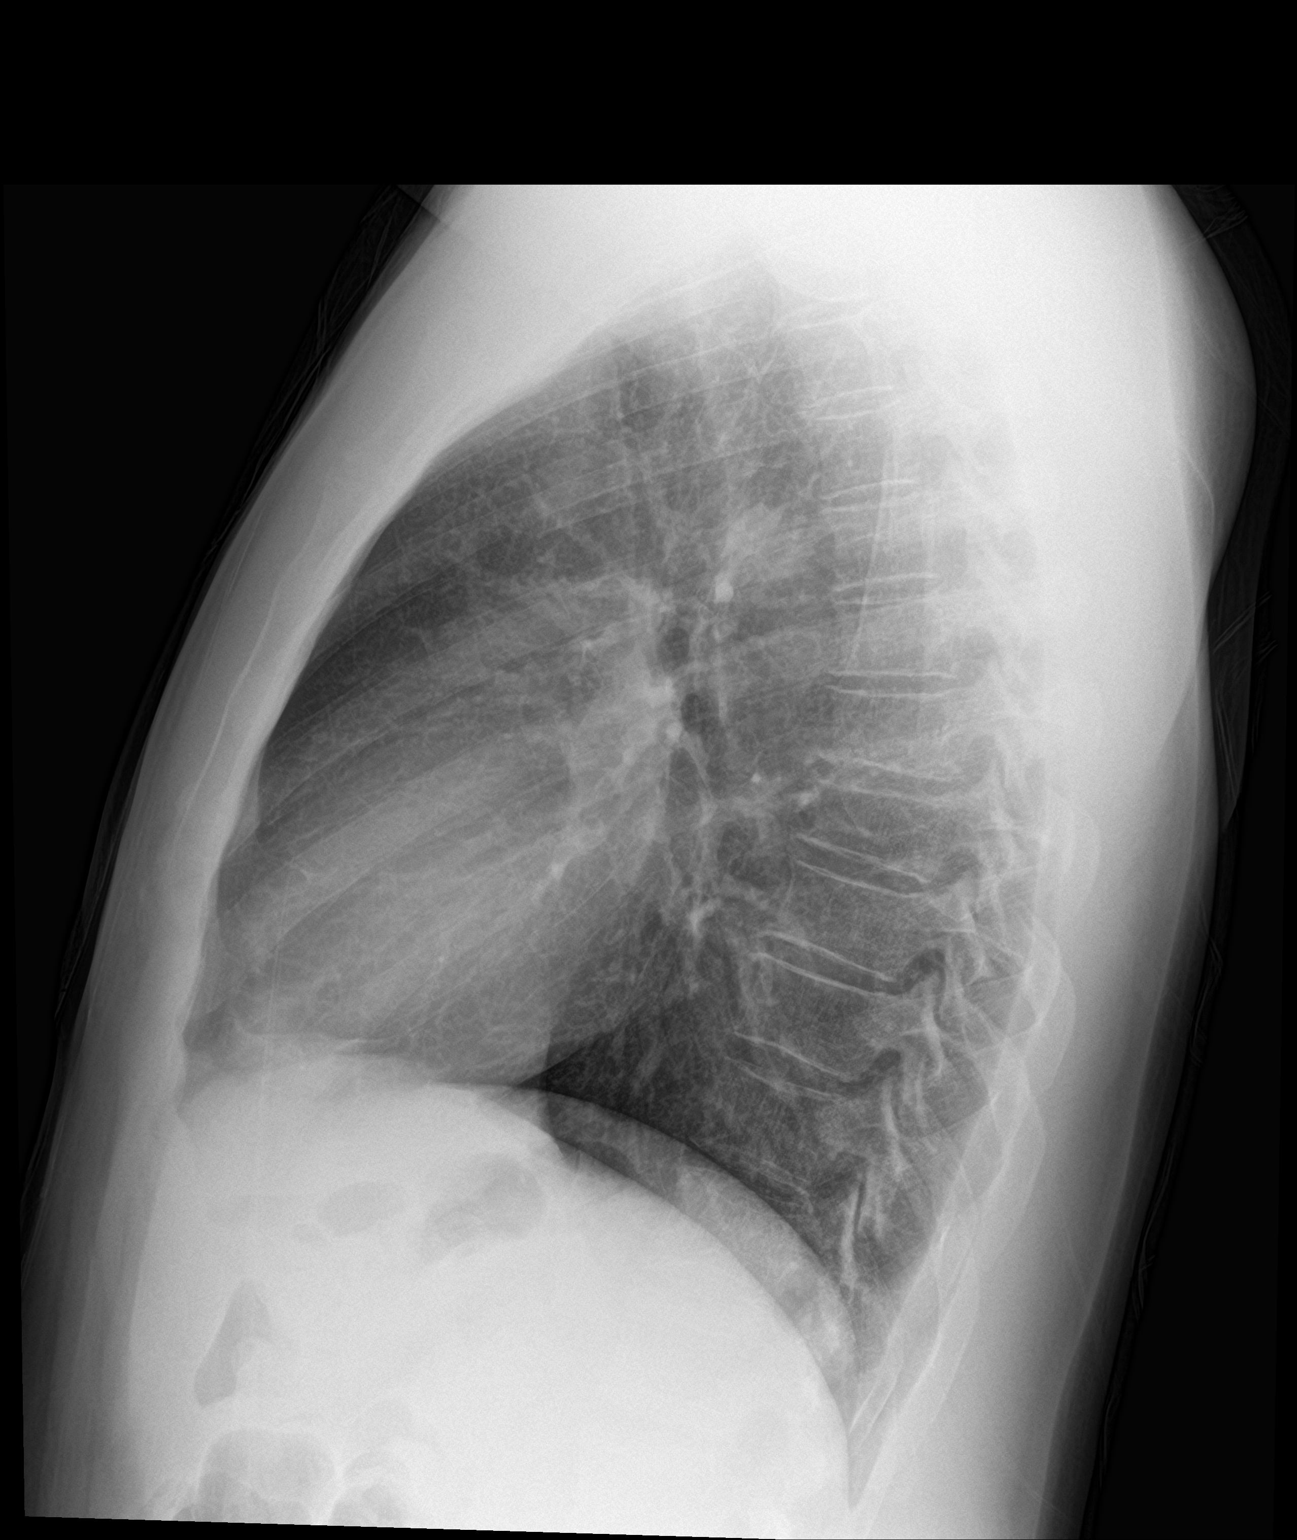

[2 of 2 positions shown; findings below may reference images not displayed]

FINDINGS: Cardiomediastinal silhouette unchanged in size and contour. No
evidence of central vascular congestion. No interlobular septal
thickening.

No pneumothorax or pleural effusion. Coarsened interstitial
markings, with no confluent airspace disease.

No acute displaced fracture. Degenerative changes of the spine.
IMPRESSION: No active cardiopulmonary disease.

## 2021-10-27 NOTE — Patient Instructions (Signed)
Medication Instructions:  Your physician recommends that you continue on your current medications as directed. Please refer to the Current Medication list given to you today.   Labwork: None today   Testing/Procedures: Chest x-ray today  Schedule CT calcium scoring  Follow-Up: 1 year  Any Other Special Instructions Will Be Listed Below (If Applicable).  If you need a refill on your cardiac medications before your next appointment, please call your pharmacy.

## 2021-12-06 ENCOUNTER — Ambulatory Visit (HOSPITAL_COMMUNITY): Payer: Medicaid Other

## 2021-12-28 DIAGNOSIS — K219 Gastro-esophageal reflux disease without esophagitis: Secondary | ICD-10-CM | POA: Diagnosis not present

## 2021-12-28 DIAGNOSIS — E7800 Pure hypercholesterolemia, unspecified: Secondary | ICD-10-CM | POA: Diagnosis not present

## 2021-12-29 LAB — CMP14+EGFR
ALT: 39 IU/L (ref 0–44)
AST: 23 IU/L (ref 0–40)
Albumin/Globulin Ratio: 1.9 (ref 1.2–2.2)
Albumin: 4.5 g/dL (ref 4.0–5.0)
Alkaline Phosphatase: 113 IU/L (ref 44–121)
BUN/Creatinine Ratio: 13 (ref 9–20)
BUN: 12 mg/dL (ref 6–24)
Bilirubin Total: 0.3 mg/dL (ref 0.0–1.2)
CO2: 21 mmol/L (ref 20–29)
Calcium: 9.1 mg/dL (ref 8.7–10.2)
Chloride: 106 mmol/L (ref 96–106)
Creatinine, Ser: 0.9 mg/dL (ref 0.76–1.27)
Globulin, Total: 2.4 g/dL (ref 1.5–4.5)
Glucose: 108 mg/dL — ABNORMAL HIGH (ref 70–99)
Potassium: 4.3 mmol/L (ref 3.5–5.2)
Sodium: 141 mmol/L (ref 134–144)
Total Protein: 6.9 g/dL (ref 6.0–8.5)
eGFR: 107 mL/min/{1.73_m2} (ref >59)

## 2021-12-29 LAB — CBC WITH DIFFERENTIAL/PLATELET
Basophils Absolute: 0.1 10*3/uL (ref 0.0–0.2)
Basos: 1 %
EOS (ABSOLUTE): 0.3 10*3/uL (ref 0.0–0.4)
Eos: 4 %
Hematocrit: 38.1 % (ref 37.5–51.0)
Hemoglobin: 12.8 g/dL — ABNORMAL LOW (ref 13.0–17.7)
Immature Grans (Abs): 0 10*3/uL (ref 0.0–0.1)
Immature Granulocytes: 0 %
Lymphocytes Absolute: 4 10*3/uL — ABNORMAL HIGH (ref 0.7–3.1)
Lymphs: 53 %
MCH: 26.3 pg — ABNORMAL LOW (ref 26.6–33.0)
MCHC: 33.6 g/dL (ref 31.5–35.7)
MCV: 78 fL — ABNORMAL LOW (ref 79–97)
Monocytes Absolute: 0.6 10*3/uL (ref 0.1–0.9)
Monocytes: 8 %
Neutrophils Absolute: 2.5 10*3/uL (ref 1.4–7.0)
Neutrophils: 34 %
Platelets: 311 10*3/uL (ref 150–450)
RBC: 4.87 x10E6/uL (ref 4.14–5.80)
RDW: 15.9 % — ABNORMAL HIGH (ref 11.6–15.4)
WBC: 7.5 10*3/uL (ref 3.4–10.8)

## 2021-12-29 LAB — LIPID PANEL WITH LDL/HDL RATIO
Cholesterol, Total: 168 mg/dL (ref 100–199)
HDL: 47 mg/dL (ref >39)
LDL Chol Calc (NIH): 103 mg/dL — ABNORMAL HIGH (ref 0–99)
LDL/HDL Ratio: 2.2 ratio (ref 0.0–3.6)
Triglycerides: 97 mg/dL (ref 0–149)
VLDL Cholesterol Cal: 18 mg/dL (ref 5–40)

## 2022-01-10 ENCOUNTER — Other Ambulatory Visit: Payer: Self-pay

## 2022-01-10 ENCOUNTER — Ambulatory Visit (INDEPENDENT_AMBULATORY_CARE_PROVIDER_SITE_OTHER): Payer: Medicaid Other | Admitting: Nurse Practitioner

## 2022-01-10 ENCOUNTER — Encounter: Payer: Self-pay | Admitting: Nurse Practitioner

## 2022-01-10 VITALS — BP 127/76 | HR 108 | Ht 69.0 in | Wt 192.0 lb

## 2022-01-10 DIAGNOSIS — R202 Paresthesia of skin: Secondary | ICD-10-CM

## 2022-01-10 DIAGNOSIS — D649 Anemia, unspecified: Secondary | ICD-10-CM

## 2022-01-10 DIAGNOSIS — R002 Palpitations: Secondary | ICD-10-CM | POA: Diagnosis not present

## 2022-01-10 DIAGNOSIS — E7800 Pure hypercholesterolemia, unspecified: Secondary | ICD-10-CM | POA: Diagnosis not present

## 2022-01-10 DIAGNOSIS — R200 Anesthesia of skin: Secondary | ICD-10-CM

## 2022-01-10 DIAGNOSIS — F17200 Nicotine dependence, unspecified, uncomplicated: Secondary | ICD-10-CM

## 2022-01-10 NOTE — Assessment & Plan Note (Signed)
States that symptoms started 2 weeks ago ?Check iron panel, vitamin B12 and folate levels. ?Further medication for anemia if needed when labs are back. ?If symptoms does not improve by next visit will do ABI or refer to vascular ?Need to quit smoking discussed with patient, verbalized understanding. ?

## 2022-01-10 NOTE — Patient Instructions (Signed)

## 2022-01-10 NOTE — Assessment & Plan Note (Signed)
Current everyday smoker ?Need to quit smoking including risk of hypertension, PAD, PVD, stroke, MI, lung cancer, COPD discussed with patient. ?Patient notes willing to quit today, states that he will work on cutting back. ?We will continue to follow-up on this. ?

## 2022-01-10 NOTE — Assessment & Plan Note (Signed)
Denies bloody stool ?Check B12 and folate level, iron panel ? will start patient on medication if needed after labs are back ?

## 2022-01-10 NOTE — Progress Notes (Signed)
? ?Isaiah Warren     MRN: [id number]      DOB: 1976/03/10 ? ? ?HPI ?Mr. Hern with medical history of hyperlipidemia, atypical chest pain GERD is here for follow up and re-evaluation of his chronic medical conditions and lab review ? ?Pt c/o of intermittent palpitations thats has been ongoing for over a year, he has been to the cardiology , had stress test done, he was seen by cardiology 2 months ago, had a stress test done states that he was told there was nothing wrong with his heart. Pt currently denies CP, dizziness, leg swelling. ? ?Pt c/o bilateral arm numbness in both arms from elbow down to the fingers, has tingling in the fingers, symptoms started  about two weeks ago, symptoms are intermittent.  Patient denies weakness, pain ? ?Continues to smoke, he has been smoking for over 20 years, , he has been smoking half a pack a day since about 6 months.  Patient not ready to quit smoking today.  ? ? ? ? ?ROS ?Denies recent fever or chills. ?Denies sinus pressure, nasal congestion, ear pain or sore throat. ?Denies chest congestion, productive cough or wheezing. ?Denies chest pains, and leg swelling,  ?Denies abdominal pain, nausea, vomiting,diarrhea or constipation.   ?Denies dysuria, frequency, hesitancy or incontinence. ?Denies joint pain, swelling and limitation in mobility. ?Denies headaches, seizures, numbness, or tingling. ?Denies depression, anxiety or insomnia. ?Denies skin break down or rash. ? ? ?PE ? ?BP 127/76 (BP Location: Right Arm, Patient Position: Sitting, Cuff Size: Large)   Pulse (!) 108   Ht 5' 9"  (1.753 m)   Wt 192 lb (87.1 kg)   SpO2 99%   BMI 28.35 kg/m?  ? ?Patient alert and oriented and in no cardiopulmonary distress. ? ? ?Chest: Clear to auscultation bilaterally. ? ?CVS: S1, S2 no murmurs, no S3.Regular rate. ? ?ABD: Soft non tender.  ? ?Ext: No edema ? ?MS: Adequate ROM spine, shoulders, hips and knees..  Sensation intact in bilateral arms has palpable radial pulses in both hands  feels warm to touch power intact ? ?Psych: Good eye contact, normal affect. Memory intact not anxious or depressed appearing. ? ?CNS: CN 2-12 intact, power,  normal throughout.no focal deficits noted. ? ? ?Assessment & Plan ? ?Hyperlipidemia ?Takes atorvastatin 40 mg daily, Zetia 10 mg daily.  Continue current medications ?The 10-year ASCVD risk score (Arnett DK, et al., 2019) is: 6.7% ?  Values used to calculate the score: ?    Age: 46 years ?    Sex: Male ?    Is Non-Hispanic African American: Yes ?    Diabetic: No ?    Tobacco smoker: Yes ?    Systolic Blood Pressure: 413 mmHg ?    Is BP treated: No ?    HDL Cholesterol: 47 mg/dL ?    Total Cholesterol: 168 mg/dL ? ? ?Lab Results  ?Component Value Date  ? CHOL 168 12/28/2021  ? HDL 47 12/28/2021  ? LDLCALC 103 (H) 12/28/2021  ? TRIG 97 12/28/2021  ? ? ?Palpitations ?Has intermittent palpitations, patient denies anxiety, chest pain ?Heart rate is 108 today ?Last EKG in epic review showed sinus rhythm. ?, had some anemia on recent CBC, patient denies bloody stool ?Check iron panel, vitamin B12 and folate levels. ?Patient told to call cardiology if he develops chest pain or palpitation gets worse. ?Lab Results  ?Component Value Date  ? WBC 7.5 12/28/2021  ? HGB 12.8 (L) 12/28/2021  ? HCT 38.1 12/28/2021  ?  MCV 78 (L) 12/28/2021  ? PLT 311 12/28/2021  ? ? ?Anemia ?Denies bloody stool ?Check B12 and folate level, iron panel ? will start patient on medication if needed after labs are back ? ?Numbness and tingling of both upper extremities ?States that symptoms started 2 weeks ago ?Check iron panel, vitamin B12 and folate levels. ?Further medication for anemia if needed when labs are back. ?If symptoms does not improve by next visit will do ABI or refer to vascular ?Need to quit smoking discussed with patient, verbalized understanding.  ?

## 2022-01-10 NOTE — Assessment & Plan Note (Signed)
Has intermittent palpitations, patient denies anxiety, chest pain ?Heart rate is 108 today ?Last EKG in epic review showed sinus rhythm. ?, had some anemia on recent CBC, patient denies bloody stool ?Check iron panel, vitamin B12 and folate levels. ?Patient told to call cardiology if he develops chest pain or palpitation gets worse. ?Lab Results  ?Component Value Date  ? WBC 7.5 12/28/2021  ? HGB 12.8 (L) 12/28/2021  ? HCT 38.1 12/28/2021  ? MCV 78 (L) 12/28/2021  ? PLT 311 12/28/2021  ? ?

## 2022-01-10 NOTE — Assessment & Plan Note (Signed)
Takes atorvastatin 40 mg daily, Zetia 10 mg daily.  Continue current medications ?The 10-year ASCVD risk score (Arnett DK, et al., 2019) is: 6.7% ?  Values used to calculate the score: ?    Age: 46 years ?    Sex: Male ?    Is Non-Hispanic African American: Yes ?    Diabetic: No ?    Tobacco smoker: Yes ?    Systolic Blood Pressure: 889 mmHg ?    Is BP treated: No ?    HDL Cholesterol: 47 mg/dL ?    Total Cholesterol: 168 mg/dL ? ? ?Lab Results  ?Component Value Date  ? CHOL 168 12/28/2021  ? HDL 47 12/28/2021  ? LDLCALC 103 (H) 12/28/2021  ? TRIG 97 12/28/2021  ? ?

## 2022-01-12 LAB — B12 AND FOLATE PANEL
Folate: 9.3 ng/mL (ref >3.0)
Vitamin B-12: 409 pg/mL (ref 232–1245)

## 2022-01-12 LAB — IRON,TIBC AND FERRITIN PANEL
Ferritin: 907 ng/mL — ABNORMAL HIGH (ref 30–400)
Iron Saturation: 29 % (ref 15–55)
Iron: 82 ug/dL (ref 38–169)
Total Iron Binding Capacity: 284 ug/dL (ref 250–450)
UIBC: 202 ug/dL (ref 111–343)

## 2022-01-12 NOTE — Progress Notes (Signed)
Normal vitamin B12 and folate levels, normal iron levels ferritin is high, will recheck at next follow-up appointment if level is still elevated will refer patient to hematology to find out the cause for elevated ferritin level. ? ?Patient should let us know if his complaints of numbness and tingling in his arms does not get better so we can refer to neurology ?Thanks

## 2022-05-15 ENCOUNTER — Ambulatory Visit: Payer: Medicaid Other | Admitting: Nurse Practitioner

## 2022-05-30 ENCOUNTER — Ambulatory Visit (HOSPITAL_COMMUNITY): Payer: Medicaid Other

## 2022-10-17 NOTE — Progress Notes (Deleted)
Cardiology Office Note    Date:  10/17/2022   ID:  BRENDON CHRISTOFFEL, DOB 31-Oct-1975, MRN [id number]  PCP:  Renee Rival, FNP  Cardiologist: Jenkins Rouge, MD    No chief complaint on file.   History of Present Illness:    Isaiah Warren is a 46 y.o. male with past medical history of HLD, atypical chest pain, family history of CAD and tobacco use who presents to the office today for F/U  First seen by me  03/2020 for follow-up from Emergency Department visit for chest pain during which troponin values had been negative and symptoms were felt to be atypical for a cardiac etiology. Given his tobacco use and family history of CAD, it was recommended that he have an exercise tolerance test but this was never performed.Pain is atypical not always exertional and sporadic with spontaneous resolution   He does continue to smoke 1 pack/day. Bothered by GERD uses tums and omeprazole   Had leg cramps with high dose lipitor Dose decreased to 40 mg and zetia added 10/12/21 LDL was 124 on 07/06/21   Had myovue with negative ECG and diaphragmatic attenuation 05/10/21  Discussed utility of calcium score to guide Rx of HLD Counseled on smoking cessation < 10 minutes   He has a 7 and 46 yo at home. Older son plays football for [employer]  Complained to primary of palpitations for months Sounded benign and ECG was NSR Labs ok with Hct 38.1 No recent TSH  ***   Past Medical History:  Diagnosis Date   History of chest pain    Hyperlipidemia     Past Surgical History:  Procedure Laterality Date   none      Current Medications: Outpatient Medications Prior to Visit  Medication Sig Dispense Refill   alum & mag hydroxide-simeth (MYLANTA) 200-200-20 MG/5ML suspension Take 15 mLs by mouth every 6 (six) hours as needed for indigestion or heartburn. 355 mL 0   atorvastatin (LIPITOR) 40 MG tablet Take 1 tablet (40 mg total) by mouth daily. 90 tablet 3   ezetimibe (ZETIA) 10 MG tablet Take  1 tablet (10 mg total) by mouth daily. 90 tablet 3   ibuprofen (ADVIL) 800 MG tablet Take by mouth.     omeprazole (PRILOSEC) 40 MG capsule Take 1 capsule (40 mg total) by mouth daily. 30 capsule 3   No facility-administered medications prior to visit.     Allergies:   Patient has no known allergies.   Social History   Socioeconomic History   Marital status: Single    Spouse name: Not on file   Number of children: 3   Years of education: Not on file   Highest education level: Not on file  Occupational History   Occupation: Unemployed  Tobacco Use   Smoking status: Every Day    Packs/day: 1.00    Years: 26.00    Total pack years: 26.00    Types: Cigarettes   Smokeless tobacco: Never   Tobacco comments:    2-3 cigarettes daily   Vaping Use   Vaping Use: Never used  Substance and Sexual Activity   Alcohol use: Not Currently    Alcohol/week: 2.0 standard drinks of alcohol    Types: 2 Cans of beer per week    Comment: "couple beers on the weekend"   Drug use: No   Sexual activity: Not Currently  Other Topics Concern   Not on file  Social History Narrative   2 children  at home and 1 is a Multimedia programmer Strain: Not on file  Food Insecurity: Not on file  Transportation Needs: Not on file  Physical Activity: Not on file  Stress: Not on file  Social Connections: Not on file     Family History:  The patient's family history includes Diabetes in his father; Hypertension in his father; Prostate cancer in his father; Throat cancer in his mother.   Review of Systems:    Please see the history of present illness.     All other systems reviewed and are otherwise negative except as noted above.   Physical Exam:    VS:  There were no vitals taken for this visit.   General: Well developed, well nourished,male appearing in no acute distress. Head: Normocephalic, atraumatic. Neck: No carotid bruits. JVD not elevated.  Lungs:  Respirations regular and unlabored, without wheezes or rales.  Heart: Regular rate and rhythm. No S3 or S4.  No murmur, no rubs, or gallops appreciated. Abdomen: Appears non-distended. No obvious abdominal masses. Msk:  Strength and tone appear normal for age. No obvious joint deformities or effusions. Extremities: No clubbing or cyanosis. No lower extremity edema.  Distal pedal pulses are 2+ bilaterally. Neuro: Alert and oriented X 3. Moves all extremities spontaneously. No focal deficits noted. Psych:  Responds to questions appropriately with a normal affect. Skin: No rashes or lesions noted  Wt Readings from Last 3 Encounters:  01/10/22 192 lb (87.1 kg)  10/27/21 189 lb (85.7 kg)  10/12/21 198 lb 0.6 oz (89.8 kg)     Studies/Labs Reviewed:   EKG:  EKG is not ordered today. EKG from 04/05/2021 is reviewed and shows NSR, HR 70 with no acute ST changes.   Recent Labs: 12/28/2021: ALT 39; BUN 12; Creatinine, Ser 0.90; Hemoglobin 12.8; Platelets 311; Potassium 4.3; Sodium 141   Lipid Panel    Component Value Date/Time   CHOL 168 12/28/2021 0832   TRIG 97 12/28/2021 0832   HDL 47 12/28/2021 0832   LDLCALC 103 (H) 12/28/2021 0832    Additional studies/ records that were reviewed today include:   CXR: 09/2020 FINDINGS: The heart size and mediastinal contours are within normal limits. Both lungs are clear. No visible pleural effusions or pneumothorax. No acute osseous abnormality.   IMPRESSION: No active cardiopulmonary disease.  Exercise Myovue 05/10/21  Study Result  Narrative & Impression  Blood pressure demonstrated a normal response to exercise. There was no ST segment deviation noted during stress. This is a low risk study. The left ventricular ejection fraction is normal (55-65%). Inferior defect with mild reversibility but normal wall motion. Findings likely secondary to differences in subdiaphragmatic attenuation, cannot completely exclude very mild inferior ischemia.  Either finding would support low risk, particularly considering low risk Duke treadmill score of 6.5     Plan:   In order of problems listed above:  1. Chest Pain with Mixed Features -   Stress myovue  05/10/21 with DTS 6.5 and diaphragmatic attenuation low risk EF 55-60%  Ordered calcium score last visit but patient did not show ***  2. HLD - on statin and zetia  labs with primary LDL 103 see above regarding utility of calcium score   3. Tobacco use  - He currently smokes 1 pack/day. Cessation was advised.  CXR 10/27/21 NAD too young for lung cancer screening CT will order CXR  4. GERD:  on omeprazole and using tums   5.  Palpitations: benign sounding no need for monitor ECG normal check TSH/T4    Medication Adjustments/Labs and Tests Ordered: Current medicines are reviewed at length with the patient today.  Concerns regarding medicines are outlined above.  Medication changes, Labs and Tests ordered today are listed in the Patient Instructions below. There are no Patient Instructions on file for this visit.   CXR/smoking age for lung  TSH/T4 palpitations  ***  F/U in a year  Signed, Jenkins Rouge, MD  10/17/2022 4:44 PM    [facility] [address] [place], [place] [address] Phone: [phone] Fax: [phone]

## 2022-10-25 ENCOUNTER — Ambulatory Visit: Payer: Medicaid Other | Admitting: Cardiovascular Disease

## 2022-10-26 ENCOUNTER — Encounter: Payer: Self-pay | Admitting: Cardiovascular Disease

## 2024-01-16 ENCOUNTER — Encounter: Payer: Self-pay | Admitting: Cardiovascular Disease

## 2024-02-18 ENCOUNTER — Telehealth: Payer: Self-pay | Admitting: Cardiovascular Disease

## 2024-02-18 NOTE — Telephone Encounter (Signed)
 Deleting recall- unable to contact pt to sch after multiple attempts

## 2024-06-19 ENCOUNTER — Other Ambulatory Visit: Payer: Self-pay

## 2024-06-19 ENCOUNTER — Emergency Department (HOSPITAL_COMMUNITY)
Admission: EM | Admit: 2024-06-19 | Discharge: 2024-06-19 | Disposition: A | Discharge status: Home / Self Care | Attending: Emergency Medicine | Admitting: Emergency Medicine

## 2024-06-19 ENCOUNTER — Encounter (HOSPITAL_COMMUNITY): Payer: Self-pay

## 2024-06-19 DIAGNOSIS — G8918 Other acute postprocedural pain: Secondary | ICD-10-CM | POA: Diagnosis not present

## 2024-06-19 DIAGNOSIS — M542 Cervicalgia: Secondary | ICD-10-CM | POA: Insufficient documentation

## 2024-06-19 DIAGNOSIS — I10 Essential (primary) hypertension: Secondary | ICD-10-CM | POA: Insufficient documentation

## 2024-06-19 DIAGNOSIS — M25512 Pain in left shoulder: Secondary | ICD-10-CM | POA: Insufficient documentation

## 2024-06-19 DIAGNOSIS — M549 Dorsalgia, unspecified: Secondary | ICD-10-CM | POA: Diagnosis present

## 2024-06-19 DIAGNOSIS — M62830 Muscle spasm of back: Secondary | ICD-10-CM | POA: Diagnosis not present

## 2024-06-19 MED ORDER — DIAZEPAM 5 MG/ML IJ SOLN
2.5000 mg | Freq: Once | INTRAMUSCULAR | Status: AC
Start: 2024-06-19 — End: 2024-06-19
  Administered 2024-06-19: 2.5 mg via INTRAVENOUS
  Filled 2024-06-19: qty 2

## 2024-06-19 MED ORDER — IBUPROFEN 600 MG PO TABS: 600.0000 mg | ORAL_TABLET | Freq: Four times a day (QID) | ORAL | 0 refills | Status: AC | PRN

## 2024-06-19 MED ORDER — LIDOCAINE 4 % EX PTCH: 1.0000 | MEDICATED_PATCH | Freq: Two times a day (BID) | CUTANEOUS | 0 refills | Status: AC

## 2024-06-19 MED ORDER — DIAZEPAM 5 MG PO TABS: 5.0000 mg | ORAL_TABLET | Freq: Once | ORAL | Status: DC

## 2024-06-19 MED ORDER — LIDOCAINE 5 % EX PTCH
1.0000 | MEDICATED_PATCH | CUTANEOUS | Status: DC
  Administered 2024-06-19: 1 via TRANSDERMAL
  Filled 2024-06-19: qty 1

## 2024-06-19 MED ORDER — METHOCARBAMOL 500 MG PO TABS: 500.0000 mg | ORAL_TABLET | Freq: Two times a day (BID) | ORAL | 0 refills | Status: AC

## 2024-06-19 MED ORDER — KETOROLAC TROMETHAMINE 15 MG/ML IJ SOLN
15.0000 mg | Freq: Once | INTRAMUSCULAR | Status: AC
  Administered 2024-06-19: 15 mg via INTRAVENOUS
  Filled 2024-06-19: qty 1

## 2024-06-19 NOTE — Discharge Instructions (Addendum)
 You are seen in the ER for back pain.  We suspect that all of your pain is because of significant spasms around your scapula or winged bone.  Please perform the stretching exercises we discussed.  Warm compresses will also help.  Massaging the tissue will also help. Take the medications on the side for additional help.  Call your doctor for follow-up next week.  If you are not improving, you might need physical therapy.  Return to the ER if you start having upper extremity weakness or numbness/tingling.

## 2024-06-19 NOTE — ED Provider Notes (Signed)
 [place] EMERGENCY DEPARTMENT AT [facility] Provider Note   CSN: [id number] Arrival date & time: 06/19/24  [id number]     Patient presents with: Post-op Problem   Isaiah Warren is a 48 y.o. male.   HPI    48 year old male comes in with chief complaint of left-sided shoulder pain, back pain.  He has history of hypertension.  He had a lipoma in his neck that was removed about a month ago.  Patient states that about a week ago he started experiencing pain in the back, around the left scapular region that is radiating towards the shoulder.  Pain is worse with any movement including that of his neck and arm.  He denies any trauma.  2 weeks ago he had a follow-up with the surgeon, at that time he was fine.   Prior to Admission medications   Medication Sig Start Date End Date Taking? Authorizing Provider  ibuprofen  (ADVIL ) 600 MG tablet Take 1 tablet (600 mg total) by mouth every 6 (six) hours as needed. 06/19/24  Yes Charlyn Sora, MD  lidocaine  4 % Place 1 patch onto the skin 2 (two) times daily. 06/19/24  Yes Charlyn Sora, MD  methocarbamol  (ROBAXIN ) 500 MG tablet Take 1 tablet (500 mg total) by mouth 2 (two) times daily. 06/19/24  Yes Charlyn Sora, MD  alum & mag hydroxide-simeth (MYLANTA) 200-200-20 MG/5ML suspension Take 15 mLs by mouth every 6 (six) hours as needed for indigestion or heartburn. 04/05/21   Elnor Fairy HERO, NP  atorvastatin  (LIPITOR) 40 MG tablet Take 1 tablet (40 mg total) by mouth daily. 10/12/21   Elnor Fairy HERO, NP  ezetimibe  (ZETIA ) 10 MG tablet Take 1 tablet (10 mg total) by mouth daily. 10/12/21   Elnor Fairy HERO, NP  omeprazole  (PRILOSEC) 40 MG capsule Take 1 capsule (40 mg total) by mouth daily. 10/12/21   Elnor Fairy HERO, NP    Allergies: Patient has no known allergies.    Review of Systems  All other systems reviewed and are negative.   Updated Vital Signs BP 129/82 Comment: Simultaneous filing. User may not have seen previous data.  Temp  98.1 F (36.7 C) (Oral)   Resp 18   Ht 5' 9 (1.753 m)   Wt 87.1 kg   SpO2 96%   BMI 28.35 kg/m   Physical Exam Vitals and nursing note reviewed.  Constitutional:      Appearance: He is well-developed.  HENT:     Head: Atraumatic.  Cardiovascular:     Rate and Rhythm: Normal rate.  Pulmonary:     Effort: Pulmonary effort is normal.  Musculoskeletal:     Cervical back: Neck supple.     Comments: Surgical scar noted at the base of the neck, below C7.  There is no evidence of any significant erythema, edema around the surgical site.  Patient has reproducible discomfort with palpation of the scapular region and also with forward flexion of the left upper extremity and when he puts his left hand over the right shoulder.  Patient has no midline C-spine tenderness  Skin:    General: Skin is warm.  Neurological:     Mental Status: He is alert and oriented to person, place, and time.     Sensory: No sensory deficit.     Motor: No weakness.     (all labs ordered are listed, but only abnormal results are displayed) Labs Reviewed - No data to display  EKG: None  Radiology: No results found.  Procedures   Medications Ordered in the ED  ketorolac  (TORADOL ) 15 MG/ML injection 15 mg (has no administration in time range)  lidocaine  (LIDODERM ) 5 % 1 patch (has no administration in time range)  diazepam  (VALIUM ) injection 2.5 mg (has no administration in time range)                                    Medical Decision Making Risk OTC drugs. Prescription drug management.   48 year old male comes in with chief complaint of neck pain and back discomfort.  Few years back he was seen in the ER for lipoma, which appears to have been removed just a month ago.  He had an MRI in the ER at that time.  The MRI did not show any spinal cord issues besides cervical spine arthritis.  I have reviewed patient's previous records, and determined that patient does not need any additional imaging  at this time.  On exam, patient has nonfocal neuroexam.  He has reproducible tenderness over the scapular region and upper part of the left back and with any activity involving the left upper extremity both active and passive.  He however has no focal weakness in the left upper extremity or any numbness.  I did consider in the differential in addition to the muscle spasm, seroma of the wound, hematoma, cervical arthritis and spinal cord impingement, but all of those seem less likely.  Will focus on symptom management.  Advised follow-up with the surgeon in 1 week if not getting better, he might need physical therapy then.  I went over some stretching exercises with him and recommended laying prone and dangling his left upper extremity to relieve the spasms.  Final diagnoses:  Muscle spasm of back  Post-operative pain    ED Discharge Orders          Ordered    methocarbamol  (ROBAXIN ) 500 MG tablet  2 times daily        06/19/24 0757    lidocaine  4 %  2 times daily        06/19/24 0757    ibuprofen  (ADVIL ) 600 MG tablet  Every 6 hours PRN        06/19/24 0757               Charlyn Sora, MD 06/19/24 [phone]

## 2024-06-19 NOTE — ED Triage Notes (Signed)
 Pov from home cc of post op complication. Had a tumor removed last month in his neck but now is having left arm pain and upper back pain. Pain has been going on for a week

## 2024-06-23 ENCOUNTER — Telehealth: Payer: Self-pay

## 2024-06-23 NOTE — Transitions of Care (Post Inpatient/ED Visit) (Signed)
   06/23/2024  Name: Isaiah Warren MRN: [id number] DOB: 05-13-1976  Today's TOC FU Call Status:   Patient's Name and Date of Birth confirmed.  Transition Care Management Follow-up Telephone Call Date of Discharge: 06/19/24 [facility] Type of Discharge: Emergency Department How have you been since you were released from the hospital?: Better Any questions or concerns?: No  Items Reviewed: Did you receive and understand the discharge instructions provided?: Yes Medications obtained,verified, and reconciled?: Yes (Medications Reviewed) Any new allergies since your discharge?: No Dietary orders reviewed?: NA Do you have support at home?: Yes People in Home [RPT]: spouse  Medications Reviewed Today: Medications Reviewed Today     Reviewed by Starlene Charlynn BIRCH, CMA (Certified Medical Assistant) on 06/23/24 at 1111  Med List Status: <None>   Medication Order Taking? Sig Documenting Provider Last Dose Status Informant  alum & mag hydroxide-simeth (MYLANTA) 200-200-20 MG/5ML suspension [id number] Yes Take 15 mLs by mouth every 6 (six) hours as needed for indigestion or heartburn. Elnor Fairy HERO, NP  Active   atorvastatin  (LIPITOR) 40 MG tablet [id number] Yes Take 1 tablet (40 mg total) by mouth daily. Elnor Fairy HERO, NP  Active   ezetimibe  (ZETIA ) 10 MG tablet [id number] Yes Take 1 tablet (10 mg total) by mouth daily. Elnor Fairy HERO, NP  Active   ibuprofen  (ADVIL ) 600 MG tablet [id number] Yes Take 1 tablet (600 mg total) by mouth every 6 (six) hours as needed. Charlyn Sora, MD  Active   lidocaine  4 % [id number] Yes Place 1 patch onto the skin 2 (two) times daily. Nanavati, Ankit, MD  Active   methocarbamol  (ROBAXIN ) 500 MG tablet [id number] Yes Take 1 tablet (500 mg total) by mouth 2 (two) times daily. Charlyn Sora, MD  Active   omeprazole  (PRILOSEC) 40 MG capsule [id number] Yes Take 1 capsule (40 mg total) by mouth daily. Elnor Fairy HERO, NP  Active   Med List Note  Lauralyn, Powell CROME, CPhT 01/14/15 1839): No pharmacy preference.             Home Care and Equipment/Supplies: Were Home Health Services Ordered?: NA Any new equipment or medical supplies ordered?: NA  Functional Questionnaire: Do you need assistance with bathing/showering or dressing?: No Do you need assistance with meal preparation?: No Do you need assistance with eating?: No Do you have difficulty maintaining continence: No Do you need assistance with getting out of bed/getting out of a chair/moving?: No Do you have difficulty managing or taking your medications?: No  Follow up appointments reviewed: PCP Follow-up appointment confirmed?: Yes Date of PCP follow-up appointment?: 07/03/24 Specialist Hospital Follow-up appointment confirmed?: NA Do you need transportation to your follow-up appointment?: No Do you understand care options if your condition(s) worsen?: Yes-patient verbalized understanding  SDOH Interventions Today    Flowsheet Row Most Recent Value  SDOH Interventions   Food Insecurity Interventions Intervention Not Indicated  Housing Interventions Intervention Not Indicated  Transportation Interventions Intervention Not Indicated    SIGNATURE Rhet Rorke, RMA

## 2024-07-03 ENCOUNTER — Inpatient Hospital Stay: Payer: Self-pay | Admitting: Nurse Practitioner

## 2024-08-10 NOTE — Therapy (Incomplete)
 OUTPATIENT PHYSICAL THERAPY CERVICAL EVALUATION   Patient Name: Isaiah Warren MRN: [id number] DOB:Mar 26, 1976, 47 y.o., male Today's Date: 08/10/2024  END OF SESSION:   Past Medical History:  Diagnosis Date   History of chest pain    Hyperlipidemia    Past Surgical History:  Procedure Laterality Date   none     Patient Active Problem List   Diagnosis Date Noted   Palpitations 01/10/2022   Anemia 01/10/2022   Numbness and tingling of both upper extremities 01/10/2022   Current smoker 01/10/2022   GERD (gastroesophageal reflux disease) 04/05/2021   Hyperlipidemia 04/05/2021   Encounter for general adult medical examination with abnormal findings 03/07/2021   Screening due 03/07/2021   Atypical chest pain 03/07/2021    PCP: ***  REFERRING PROVIDER: ***  REFERRING DIAG: ***  THERAPY DIAG:  No diagnosis found.  Rationale for Evaluation and Treatment: {HABREHAB:27488}  ONSET DATE: ***  SUBJECTIVE:                                                                                                                                                                                                         SUBJECTIVE STATEMENT: *** Hand dominance: {MISC; OT HAND [phone]}  PERTINENT HISTORY:  ***  PAIN:  Are you having pain? {OPRCPAIN:27236}  PRECAUTIONS: {Therapy precautions:24002}  RED FLAGS: {PT Red Flags:29287}     WEIGHT BEARING RESTRICTIONS: {Yes ***/No:24003}  FALLS:  Has patient fallen in last 6 months? {fallsyesno:27318}  LIVING ENVIRONMENT: Lives with: {OPRC lives with:25569::lives with their family} Lives in: {Lives in:25570} Stairs: {opstairs:27293} Has following equipment at home: {Assistive devices:23999}  OCCUPATION: ***  PLOF: {PLOF:24004}  PATIENT GOALS: ***  NEXT MD VISIT: ***  OBJECTIVE:  Note: Objective measures were completed at Evaluation unless otherwise noted.  DIAGNOSTIC FINDINGS:  ***  PATIENT SURVEYS:   {rehab surveys:24030}  COGNITION: Overall cognitive status: {cognition:24006}  SENSATION: {sensation:27233}  POSTURE: {posture:25561}  PALPATION: ***   CERVICAL ROM:   {AROM/PROM:27142} ROM A/PROM (deg) eval  Flexion   Extension   Right lateral flexion   Left lateral flexion   Right rotation   Left rotation    (Blank rows = not tested)  UPPER EXTREMITY ROM:  {AROM/PROM:27142} ROM Right eval Left eval  Shoulder flexion    Shoulder extension    Shoulder abduction    Shoulder adduction    Shoulder extension    Shoulder internal rotation    Shoulder external rotation    Elbow flexion    Elbow extension    Wrist flexion    Wrist extension  Wrist ulnar deviation    Wrist radial deviation    Wrist pronation    Wrist supination     (Blank rows = not tested)  UPPER EXTREMITY MMT:  MMT Right eval Left eval  Shoulder flexion    Shoulder extension    Shoulder abduction    Shoulder adduction    Shoulder extension    Shoulder internal rotation    Shoulder external rotation    Middle trapezius    Lower trapezius    Elbow flexion    Elbow extension    Wrist flexion    Wrist extension    Wrist ulnar deviation    Wrist radial deviation    Wrist pronation    Wrist supination    Grip strength     (Blank rows = not tested)  CERVICAL SPECIAL TESTS:  {Cervical special tests:25246}  FUNCTIONAL TESTS:  {Functional tests:24029}  TREATMENT DATE: ***                                                                                                                                 PATIENT EDUCATION:  Education details: *** Person educated: {Person educated:25204} Education method: {Education Method:25205} Education comprehension: {Education Comprehension:25206}  HOME EXERCISE PROGRAM: ***  ASSESSMENT:  CLINICAL IMPRESSION: Patient is a *** y.o. *** who was seen today for physical therapy evaluation and treatment for ***.   OBJECTIVE IMPAIRMENTS:  {opptimpairments:25111}.   ACTIVITY LIMITATIONS: {activitylimitations:27494}  PARTICIPATION LIMITATIONS: {participationrestrictions:25113}  PERSONAL FACTORS: {Personal factors:25162} are also affecting patient's functional outcome.   REHAB POTENTIAL: {rehabpotential:25112}  CLINICAL DECISION MAKING: {clinical decision making:25114}  EVALUATION COMPLEXITY: {Evaluation complexity:25115}   GOALS: Goals reviewed with patient? {yes/no:20286}  SHORT TERM GOALS: Target date: ***  *** Baseline:  Goal status: INITIAL  2.  *** Baseline:  Goal status: INITIAL  3.  *** Baseline:  Goal status: INITIAL  4.  *** Baseline:  Goal status: INITIAL  5.  *** Baseline:  Goal status: INITIAL  6.  *** Baseline:  Goal status: INITIAL  LONG TERM GOALS: Target date: ***  *** Baseline:  Goal status: INITIAL  2.  *** Baseline:  Goal status: INITIAL  3.  *** Baseline:  Goal status: INITIAL  4.  *** Baseline:  Goal status: INITIAL  5.  *** Baseline:  Goal status: INITIAL  6.  *** Baseline:  Goal status: INITIAL   PLAN:  PT FREQUENCY: {rehab frequency:25116}  PT DURATION: {rehab duration:25117}  PLANNED INTERVENTIONS: {rehab planned interventions:25118::97110-Therapeutic exercises,97530- Therapeutic [phone]- Neuromuscular re-education,97535- Self Rjmz,02859- Manual therapy,Patient/Family education}  PLAN FOR NEXT SESSION: ***   Greig GORMAN Quivers, PT 08/10/2024, 1:27 PM

## 2024-08-11 ENCOUNTER — Ambulatory Visit (HOSPITAL_COMMUNITY): Attending: Family Medicine

## 2024-08-11 ENCOUNTER — Encounter (HOSPITAL_COMMUNITY): Payer: Self-pay

## 2024-08-11 ENCOUNTER — Other Ambulatory Visit: Payer: Self-pay

## 2024-08-11 DIAGNOSIS — M436 Torticollis: Secondary | ICD-10-CM | POA: Insufficient documentation

## 2024-08-11 DIAGNOSIS — M6289 Other specified disorders of muscle: Secondary | ICD-10-CM | POA: Insufficient documentation

## 2024-08-11 DIAGNOSIS — M542 Cervicalgia: Secondary | ICD-10-CM | POA: Diagnosis present
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
## 2024-08-17 ENCOUNTER — Ambulatory Visit (HOSPITAL_COMMUNITY): Admitting: Physical Therapy

## 2024-08-17 DIAGNOSIS — M436 Torticollis: Secondary | ICD-10-CM

## 2024-08-17 DIAGNOSIS — M542 Cervicalgia: Secondary | ICD-10-CM

## 2024-08-17 DIAGNOSIS — M6289 Other specified disorders of muscle: Secondary | ICD-10-CM
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
## 2024-08-18 ENCOUNTER — Ambulatory Visit (HOSPITAL_COMMUNITY): Admitting: Physical Therapy

## 2024-08-18 DIAGNOSIS — M6289 Other specified disorders of muscle: Secondary | ICD-10-CM

## 2024-08-18 DIAGNOSIS — M542 Cervicalgia: Secondary | ICD-10-CM

## 2024-08-18 DIAGNOSIS — M436 Torticollis: Secondary | ICD-10-CM
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
## 2024-08-25 ENCOUNTER — Encounter (HOSPITAL_COMMUNITY): Payer: Self-pay

## 2024-08-25 ENCOUNTER — Ambulatory Visit (HOSPITAL_COMMUNITY)

## 2024-08-25 DIAGNOSIS — M6289 Other specified disorders of muscle: Secondary | ICD-10-CM

## 2024-08-25 DIAGNOSIS — M542 Cervicalgia: Secondary | ICD-10-CM

## 2024-08-25 DIAGNOSIS — M436 Torticollis: Secondary | ICD-10-CM
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
## 2024-08-28 ENCOUNTER — Encounter (HOSPITAL_COMMUNITY): Payer: Self-pay

## 2024-08-28 ENCOUNTER — Ambulatory Visit (HOSPITAL_COMMUNITY)

## 2024-08-28 DIAGNOSIS — M542 Cervicalgia: Secondary | ICD-10-CM | POA: Diagnosis not present

## 2024-08-28 DIAGNOSIS — M6289 Other specified disorders of muscle: Secondary | ICD-10-CM

## 2024-08-28 DIAGNOSIS — M436 Torticollis: Secondary | ICD-10-CM
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
## 2024-09-01 ENCOUNTER — Ambulatory Visit (HOSPITAL_COMMUNITY): Attending: Family Medicine

## 2024-09-01 ENCOUNTER — Encounter (HOSPITAL_COMMUNITY): Payer: Self-pay

## 2024-09-01 DIAGNOSIS — M6289 Other specified disorders of muscle: Secondary | ICD-10-CM | POA: Insufficient documentation

## 2024-09-01 DIAGNOSIS — M436 Torticollis: Secondary | ICD-10-CM | POA: Insufficient documentation

## 2024-09-01 DIAGNOSIS — M542 Cervicalgia: Secondary | ICD-10-CM | POA: Insufficient documentation
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
## 2024-09-04 ENCOUNTER — Ambulatory Visit (HOSPITAL_COMMUNITY)

## 2024-09-09 ENCOUNTER — Ambulatory Visit (HOSPITAL_COMMUNITY)

## 2024-09-09 ENCOUNTER — Encounter (HOSPITAL_COMMUNITY): Payer: Self-pay

## 2024-09-09 DIAGNOSIS — M436 Torticollis: Secondary | ICD-10-CM

## 2024-09-09 DIAGNOSIS — M542 Cervicalgia: Secondary | ICD-10-CM

## 2024-09-09 DIAGNOSIS — M6289 Other specified disorders of muscle: Secondary | ICD-10-CM
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
## 2024-09-16 ENCOUNTER — Ambulatory Visit (HOSPITAL_COMMUNITY)

## 2024-09-16 ENCOUNTER — Encounter (HOSPITAL_COMMUNITY): Payer: Self-pay

## 2024-09-16 DIAGNOSIS — M542 Cervicalgia: Secondary | ICD-10-CM

## 2024-09-16 DIAGNOSIS — M6289 Other specified disorders of muscle: Secondary | ICD-10-CM

## 2024-09-16 DIAGNOSIS — M436 Torticollis: Secondary | ICD-10-CM
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
## 2024-09-23 ENCOUNTER — Ambulatory Visit (HOSPITAL_COMMUNITY)

## 2024-09-23 ENCOUNTER — Encounter (HOSPITAL_COMMUNITY): Payer: Self-pay

## 2024-09-23 DIAGNOSIS — M6289 Other specified disorders of muscle: Secondary | ICD-10-CM

## 2024-09-23 DIAGNOSIS — M542 Cervicalgia: Secondary | ICD-10-CM | POA: Diagnosis not present

## 2024-09-23 DIAGNOSIS — M436 Torticollis: Secondary | ICD-10-CM
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
## 2024-09-28 ENCOUNTER — Encounter (HOSPITAL_COMMUNITY): Payer: Self-pay

## 2024-09-28 ENCOUNTER — Ambulatory Visit (HOSPITAL_COMMUNITY): Attending: Family Medicine

## 2024-09-28 DIAGNOSIS — M6289 Other specified disorders of muscle: Secondary | ICD-10-CM | POA: Insufficient documentation

## 2024-09-28 DIAGNOSIS — M436 Torticollis: Secondary | ICD-10-CM | POA: Diagnosis present

## 2024-09-28 DIAGNOSIS — M542 Cervicalgia: Secondary | ICD-10-CM | POA: Diagnosis present
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
## 2024-10-05 ENCOUNTER — Ambulatory Visit (HOSPITAL_COMMUNITY): Admitting: Physical Therapy

## 2024-10-12 ENCOUNTER — Encounter (HOSPITAL_COMMUNITY): Payer: Self-pay

## 2024-10-12 ENCOUNTER — Ambulatory Visit (HOSPITAL_COMMUNITY)

## 2024-10-12 DIAGNOSIS — M436 Torticollis: Secondary | ICD-10-CM

## 2024-10-12 DIAGNOSIS — M542 Cervicalgia: Secondary | ICD-10-CM | POA: Diagnosis not present

## 2024-10-12 DIAGNOSIS — M6289 Other specified disorders of muscle: Secondary | ICD-10-CM
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
## 2024-10-20 ENCOUNTER — Ambulatory Visit (HOSPITAL_COMMUNITY)

## 2024-10-20 ENCOUNTER — Encounter (HOSPITAL_COMMUNITY): Payer: Self-pay

## 2024-10-20 DIAGNOSIS — M542 Cervicalgia: Secondary | ICD-10-CM | POA: Diagnosis not present

## 2024-10-20 DIAGNOSIS — M436 Torticollis: Secondary | ICD-10-CM

## 2024-10-20 DIAGNOSIS — M6289 Other specified disorders of muscle: Secondary | ICD-10-CM
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
## 2024-10-30 ENCOUNTER — Ambulatory Visit (HOSPITAL_COMMUNITY): Attending: Family Medicine

## 2024-10-30 ENCOUNTER — Telehealth (HOSPITAL_COMMUNITY): Payer: Self-pay

## 2024-10-30 DIAGNOSIS — M6289 Other specified disorders of muscle: Secondary | ICD-10-CM | POA: Insufficient documentation

## 2024-10-30 DIAGNOSIS — M436 Torticollis: Secondary | ICD-10-CM | POA: Insufficient documentation

## 2024-10-30 DIAGNOSIS — M542 Cervicalgia: Secondary | ICD-10-CM | POA: Insufficient documentation

## 2024-10-30 NOTE — Telephone Encounter (Signed)
 No show #1, called and spoke to pt who had forgotten about today's apt. Reminded next apt date and time wiht contact number included if needs to cancel/reschedule in the future.   Augustin Mclean, LPTA/CLT; WILLAIM [phone]

## 2024-11-03 ENCOUNTER — Ambulatory Visit (HOSPITAL_COMMUNITY)

## 2024-11-03 ENCOUNTER — Encounter (HOSPITAL_COMMUNITY): Payer: Self-pay

## 2024-11-03 DIAGNOSIS — M542 Cervicalgia: Secondary | ICD-10-CM

## 2024-11-03 DIAGNOSIS — M6289 Other specified disorders of muscle: Secondary | ICD-10-CM

## 2024-11-03 DIAGNOSIS — M436 Torticollis: Secondary | ICD-10-CM
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
## 2024-11-10 ENCOUNTER — Ambulatory Visit (HOSPITAL_COMMUNITY)
